# Patient Record
Sex: Male | Born: 1945 | Race: White | Hispanic: No | Marital: Married | Smoking: Former smoker
Health system: Southern US, Community
[De-identification: ages and names within clinical notes are randomized; demographics above are authoritative.]

## PROBLEM LIST (undated history)

## (undated) DIAGNOSIS — I10 Essential (primary) hypertension: Secondary | ICD-10-CM

## (undated) DIAGNOSIS — M1990 Unspecified osteoarthritis, unspecified site: Secondary | ICD-10-CM

## (undated) DIAGNOSIS — E559 Vitamin D deficiency, unspecified: Secondary | ICD-10-CM

## (undated) DIAGNOSIS — E119 Type 2 diabetes mellitus without complications: Secondary | ICD-10-CM

## (undated) DIAGNOSIS — G4730 Sleep apnea, unspecified: Secondary | ICD-10-CM

## (undated) DIAGNOSIS — C170 Malignant neoplasm of duodenum: Secondary | ICD-10-CM

## (undated) DIAGNOSIS — J449 Chronic obstructive pulmonary disease, unspecified: Secondary | ICD-10-CM

## (undated) DIAGNOSIS — I714 Abdominal aortic aneurysm, without rupture, unspecified: Secondary | ICD-10-CM

## (undated) DIAGNOSIS — I209 Angina pectoris, unspecified: Secondary | ICD-10-CM

## (undated) DIAGNOSIS — I2510 Atherosclerotic heart disease of native coronary artery without angina pectoris: Secondary | ICD-10-CM

## (undated) DIAGNOSIS — K219 Gastro-esophageal reflux disease without esophagitis: Secondary | ICD-10-CM

## (undated) HISTORY — DX: Essential (primary) hypertension: I10

## (undated) HISTORY — PX: JOINT REPLACEMENT: SHX530

## (undated) HISTORY — DX: Abdominal aortic aneurysm, without rupture: I71.4

## (undated) HISTORY — DX: Type 2 diabetes mellitus without complications: E11.9

## (undated) HISTORY — DX: Abdominal aortic aneurysm, without rupture, unspecified: I71.40

## (undated) HISTORY — PX: REPLACEMENT TOTAL KNEE: SUR1224

## (undated) HISTORY — PX: PENILE PROSTHESIS IMPLANT: SHX240

## (undated) HISTORY — DX: Chronic obstructive pulmonary disease, unspecified: J44.9

## (undated) HISTORY — PX: CHOLECYSTECTOMY: SHX55

## (undated) HISTORY — PX: BARIATRIC SURGERY: SHX1103

## (undated) HISTORY — PX: EYE SURGERY: SHX253

## (undated) HISTORY — DX: Atherosclerotic heart disease of native coronary artery without angina pectoris: I25.10

## (undated) HISTORY — DX: Vitamin D deficiency, unspecified: E55.9

## (undated) HISTORY — DX: Gastro-esophageal reflux disease without esophagitis: K21.9

## (undated) HISTORY — DX: Sleep apnea, unspecified: G47.30

---

## 1995-02-02 DIAGNOSIS — I2510 Atherosclerotic heart disease of native coronary artery without angina pectoris: Secondary | ICD-10-CM

## 1995-02-02 HISTORY — DX: Atherosclerotic heart disease of native coronary artery without angina pectoris: I25.10

## 1995-02-02 HISTORY — PX: CORONARY ARTERY BYPASS GRAFT: SHX141

## 2013-05-03 HISTORY — PX: CARDIAC CATHETERIZATION: SHX172

## 2013-09-06 ENCOUNTER — Ambulatory Visit (INDEPENDENT_AMBULATORY_CARE_PROVIDER_SITE_OTHER): Payer: Medicare Other | Admitting: Cardiovascular Disease

## 2013-09-06 ENCOUNTER — Encounter: Payer: Self-pay | Admitting: Cardiovascular Disease

## 2013-09-06 VITALS — BP 130/82 | HR 78 | Ht 67.0 in | Wt 266.5 lb

## 2013-09-06 DIAGNOSIS — Z01810 Encounter for preprocedural cardiovascular examination: Secondary | ICD-10-CM

## 2013-09-06 DIAGNOSIS — R0602 Shortness of breath: Secondary | ICD-10-CM

## 2013-09-06 DIAGNOSIS — E785 Hyperlipidemia, unspecified: Secondary | ICD-10-CM

## 2013-09-06 DIAGNOSIS — I10 Essential (primary) hypertension: Secondary | ICD-10-CM

## 2013-09-06 DIAGNOSIS — I25708 Atherosclerosis of coronary artery bypass graft(s), unspecified, with other forms of angina pectoris: Secondary | ICD-10-CM

## 2013-09-06 DIAGNOSIS — I209 Angina pectoris, unspecified: Secondary | ICD-10-CM

## 2013-09-06 DIAGNOSIS — E118 Type 2 diabetes mellitus with unspecified complications: Secondary | ICD-10-CM | POA: Insufficient documentation

## 2013-09-06 DIAGNOSIS — K219 Gastro-esophageal reflux disease without esophagitis: Secondary | ICD-10-CM

## 2013-09-06 DIAGNOSIS — Z951 Presence of aortocoronary bypass graft: Secondary | ICD-10-CM

## 2013-09-06 DIAGNOSIS — J438 Other emphysema: Secondary | ICD-10-CM

## 2013-09-06 DIAGNOSIS — J449 Chronic obstructive pulmonary disease, unspecified: Secondary | ICD-10-CM | POA: Insufficient documentation

## 2013-09-06 DIAGNOSIS — I25810 Atherosclerosis of coronary artery bypass graft(s) without angina pectoris: Secondary | ICD-10-CM

## 2013-09-06 NOTE — Assessment & Plan Note (Signed)
40 years of smoking, stopped 7 years ago. Currently on several inhalers with stable breathing

## 2013-09-06 NOTE — Patient Instructions (Addendum)
You are doing well. No medication changes were made.  We will schedule an echocardiogram for shortness of breath, CABG  Please call us if you have new issues that need to be addressed before your next appt.  Your physician wants you to follow-up in: 6 months.  You will receive a reminder letter in the mail two months in advance. If you don't receive a letter, please call our office to schedule the follow-up appointment.

## 2013-09-06 NOTE — Assessment & Plan Note (Signed)
Records were reviewed including his catheterization report from April 2015. Details in the history of present illness. Medical management recommended. Will need aggressive workup for any additional chest pain

## 2013-09-06 NOTE — Assessment & Plan Note (Addendum)
Hemoglobin A1c 6.4. Recommended he continue to aggressively watch his diet. Strongly suggested he work out on his treadmill daily

## 2013-09-06 NOTE — Assessment & Plan Note (Signed)
We have encouraged continued exercise, careful diet management in an effort to lose weight. 

## 2013-09-06 NOTE — Assessment & Plan Note (Signed)
Cholesterol is at goal on the current lipid regimen. No changes to the medications were made.  

## 2013-09-06 NOTE — Assessment & Plan Note (Signed)
Blood pressure is well controlled on today's visit. No changes made to the medications. 

## 2013-09-06 NOTE — Assessment & Plan Note (Addendum)
No symptoms of reflux. Tolerating PPI daily

## 2013-09-06 NOTE — Progress Notes (Signed)
Patient ID: Mario Ward, male    DOB: Sep 14, 1945, 68 y.o.   MRN: [id number]  HPI Comments: Mario Ward is a very pleasant 68 year old gentleman with a history of coronary artery disease, bypass x4 on 02/08/1995, history of diabetes, long history of smoking for 40 years stopped 7 years ago, obesity, obstructive sleep apnea who is compliant with his CPAP, hyperlipidemia who presents to establish care in the [place] office.  Reports that he has not had significant cardiac workup over the past 18 years until 05/02/2013. He developed chest discomfort, malaise, did not feel well. His wife called 52 and he was initially taken to a hospital in [place], and transferred to [facility] where he underwent cardiac catheterization  Cardiac catheterization showed severe three-vessel coronary artery disease. He had an occluded RCA, occluded mid LAD, occluded marginal branch off the circumflex,  He had a vein graft to the diagonal, vein graft to the OM, occluded vein graft to the RCA, patent LIMA to the LAD. Medical management was recommended Ejection fraction estimated at 55%, normal wall motion.   He does not do any regular exercise. He Has shortness of breath with exertion which she reports is his baseline. He can walk on a flat surface, shortness of breath with hills and stairs. Denies having any significant lower extremity edema. He reports that his HCTZ dose was previously decreased level uncertain if this was for low blood pressure or mild dehydration It was recommended that he have an echocardiogram from his cardiologist at [facility] given his shortness of breath. Right heart catheterization was not done  Hemoglobin A1c 6.4, low vitamin D, total cholesterol 137, LDL 65, HDL 37, triglycerides in the 170 range  Bypass surgery was performed at [facility] in [facility]  EKG today shows normal sinus rhythm with rate 78 beats a minute, no significant ST or T wave  changes   Outpatient Encounter Prescriptions as of 09/06/2013  Medication Sig  . albuterol (PROVENTIL) (2.5 MG/3ML) 0.083% nebulizer solution Take 2.5 mg by nebulization every 2 (two) hours as needed for wheezing or shortness of breath.  Marland Kitchen aspirin EC 81 MG tablet Take 81 mg by mouth daily.  . clopidogrel (PLAVIX) 75 MG tablet Take 75 mg by mouth daily.  . Exenatide ER (BYDUREON) 2 MG PEN Inject 2 mg into the skin once a week.  . Fluticasone-Salmeterol (ADVAIR DISKUS) 250-50 MCG/DOSE AEPB Inhale 1 puff into the lungs 2 (two) times daily.  . hydrochlorothiazide (MICROZIDE) 12.5 MG capsule Take 12.5 mg by mouth daily.  . Ipratropium-Albuterol (COMBIVENT RESPIMAT) 20-100 MCG/ACT AERS respimat Inhale 1 puff into the lungs every 6 (six) hours.  Marland Kitchen lisinopril (PRINIVIL,ZESTRIL) 40 MG tablet Take 40 mg by mouth daily.  . metFORMIN (GLUCOPHAGE) 1000 MG tablet Take 1,000 mg by mouth 2 (two) times daily with a meal.  . metoprolol (LOPRESSOR) 100 MG tablet Take 100 mg by mouth 2 (two) times daily.  . Multiple Vitamin (MULTIVITAMIN) tablet Take 1 tablet by mouth daily.  . nitroGLYCERIN (NITROSTAT) 0.4 MG SL tablet Place 0.4 mg under the tongue every 5 (five) minutes as needed for chest pain.  . NON FORMULARY Oxygen 2 liters at bedtime.  . NON FORMULARY CPAP at bedtime.  . Olopatadine HCl (PATADAY) 0.2 % SOLN Apply to eye.  . pantoprazole (PROTONIX) 40 MG tablet Take 40 mg by mouth daily.  . ranolazine (RANEXA) 500 MG 12 hr tablet Take 500 mg by mouth 2 (two) times daily.  . simvastatin (ZOCOR)  20 MG tablet Take 20 mg by mouth daily.  Marland Kitchen tiotropium (SPIRIVA) 18 MCG inhalation capsule Place 18 mcg into inhaler and inhale daily.   Review of Systems  Constitutional: Negative.   HENT: Negative.   Eyes: Negative.   Respiratory: Positive for shortness of breath.   Cardiovascular: Negative.   Gastrointestinal: Negative.   Endocrine: Negative.   Musculoskeletal: Negative.   Skin: Negative.    Allergic/Immunologic: Negative.   Neurological: Negative.   Hematological: Negative.   Psychiatric/Behavioral: Negative.   All other systems reviewed and are negative.   BP 130/82  Pulse 78  Ht 5\' 7"  (1.702 m)  Wt 266 lb 8 oz (120.884 kg)  BMI 41.73 kg/m2  Physical Exam  Nursing note and vitals reviewed. Constitutional: He is oriented to person, place, and time. He appears well-developed and well-nourished.  Obese  HENT:  Head: Normocephalic.  Nose: Nose normal.  Mouth/Throat: Oropharynx is clear and moist.  Eyes: Conjunctivae are normal. Pupils are equal, round, and reactive to light.  Neck: Normal range of motion. Neck supple. No JVD present.  Cardiovascular: Normal rate, regular rhythm, S1 normal, S2 normal, normal heart sounds and intact distal pulses.  Exam reveals no gallop and no friction rub.   No murmur heard. Pulmonary/Chest: Effort normal and breath sounds normal. No respiratory distress. He has no wheezes. He has no rales. He exhibits no tenderness.  Abdominal: Soft. Bowel sounds are normal. He exhibits no distension. There is no tenderness.  Musculoskeletal: Normal range of motion. He exhibits no edema and no tenderness.  Lymphadenopathy:    He has no cervical adenopathy.  Neurological: He is alert and oriented to person, place, and time. Coordination normal.  Skin: Skin is warm and dry. No rash noted. No erythema.  Psychiatric: He has a normal mood and affect. His behavior is normal. Judgment and thought content normal.      Assessment and Plan

## 2013-09-06 NOTE — Assessment & Plan Note (Signed)
He would like to proceed with workup for gastric bypass. Recent catheterization showing stable coronary disease with normal ejection fraction. He would be acceptable risk with no further workup needed

## 2013-09-06 NOTE — Assessment & Plan Note (Signed)
Occluded vein graft to the right. Other vein grafts patent.

## 2013-09-06 NOTE — Assessment & Plan Note (Signed)
Etiology of his shortness of breath is likely multifactorial. Echocardiogram has been ordered to rule out  pulmonary hypertension or underlying valvular disease . Recent cardiac catheterization showing occluded RCA and vein graft to the RCA . Other causes of breathing include obesity, deconditioning, COPD

## 2013-09-13 ENCOUNTER — Institutional Professional Consult (permissible substitution): Payer: Self-pay | Admitting: Internal Medicine

## 2013-09-14 ENCOUNTER — Other Ambulatory Visit: Payer: Self-pay

## 2013-09-14 ENCOUNTER — Other Ambulatory Visit (INDEPENDENT_AMBULATORY_CARE_PROVIDER_SITE_OTHER): Payer: Medicare Other

## 2013-09-14 DIAGNOSIS — I2510 Atherosclerotic heart disease of native coronary artery without angina pectoris: Secondary | ICD-10-CM

## 2013-09-14 DIAGNOSIS — I25708 Atherosclerosis of coronary artery bypass graft(s), unspecified, with other forms of angina pectoris: Secondary | ICD-10-CM

## 2013-09-14 DIAGNOSIS — R0602 Shortness of breath: Secondary | ICD-10-CM

## 2013-09-14 DIAGNOSIS — Z951 Presence of aortocoronary bypass graft: Secondary | ICD-10-CM

## 2013-09-19 ENCOUNTER — Other Ambulatory Visit: Payer: Self-pay | Admitting: Pulmonary Disease

## 2013-09-19 DIAGNOSIS — J438 Other emphysema: Secondary | ICD-10-CM

## 2013-09-29 ENCOUNTER — Encounter: Payer: Self-pay | Admitting: Pulmonary Disease

## 2013-09-29 DIAGNOSIS — J9611 Chronic respiratory failure with hypoxia: Secondary | ICD-10-CM | POA: Insufficient documentation

## 2013-10-01 ENCOUNTER — Telehealth: Payer: Self-pay

## 2013-10-01 NOTE — Telephone Encounter (Signed)
Spoke with pt, he is aware of results and recs.  He wishes to switch his 02 and cpap to another DME company but does not know the name of the company at this time.  He has a consult appt on Thursday 9/3 at 3:00.  He wishes to find the name of that company and do this on Thursday after having his company.    Just forwarding to you as an fyi.

## 2013-10-01 NOTE — Telephone Encounter (Signed)
Message copied by Len Blalock on Mon Oct 01, 2013  1:46 PM ------      Message from: Juanito Doom      Created: Sat Sep 29, 2013  6:54 AM       A,      Please let him know that even on CPAP his O2 saturation dropped to less than 88% quit a lot so he needs to have 2L O2 bleed-in through the CPAP machine.  Please arrange.      Thanks      B ------

## 2013-10-04 ENCOUNTER — Encounter: Payer: Self-pay | Admitting: Pulmonary Disease

## 2013-10-04 ENCOUNTER — Ambulatory Visit (INDEPENDENT_AMBULATORY_CARE_PROVIDER_SITE_OTHER): Payer: Medicare Other | Admitting: Pulmonary Disease

## 2013-10-04 VITALS — BP 128/64 | HR 91 | Ht 67.0 in | Wt 263.0 lb

## 2013-10-04 DIAGNOSIS — J9610 Chronic respiratory failure, unspecified whether with hypoxia or hypercapnia: Secondary | ICD-10-CM

## 2013-10-04 DIAGNOSIS — R0902 Hypoxemia: Secondary | ICD-10-CM

## 2013-10-04 DIAGNOSIS — J438 Other emphysema: Secondary | ICD-10-CM

## 2013-10-04 DIAGNOSIS — J9611 Chronic respiratory failure with hypoxia: Secondary | ICD-10-CM

## 2013-10-04 DIAGNOSIS — R0602 Shortness of breath: Secondary | ICD-10-CM

## 2013-10-04 DIAGNOSIS — I209 Angina pectoris, unspecified: Secondary | ICD-10-CM

## 2013-10-04 DIAGNOSIS — I25810 Atherosclerosis of coronary artery bypass graft(s) without angina pectoris: Secondary | ICD-10-CM

## 2013-10-04 DIAGNOSIS — G4733 Obstructive sleep apnea (adult) (pediatric): Secondary | ICD-10-CM

## 2013-10-04 MED ORDER — TIOTROPIUM BROMIDE MONOHYDRATE 2.5 MCG/ACT IN AERS: 2.0000 | INHALATION_SPRAY | Freq: Every day | RESPIRATORY_TRACT | Status: DC

## 2013-10-04 NOTE — Assessment & Plan Note (Signed)
We need to obtain records from his prior pulmonologist for pulmonary function testing. However, I do not consider him to have very severe COPD considering the fact that he has no cough and no history of recurrent exacerbations. I believe that the majority of his dyspnea is related to his severe obesity and deconditioning.  I believe that it would be safe for him to undergo bariatric surgery, and in fact I think it will help his overall condition dramatically.  Plan: -Continue Spiriva and Advair -Obtain records of prior pulmonary office visits as well as pulmonary function testing -Flu shot as soon as available -Followup 6 months

## 2013-10-04 NOTE — Assessment & Plan Note (Signed)
He will continue to use 2 L of oxygen through CPAP at night

## 2013-10-04 NOTE — Assessment & Plan Note (Signed)
He had a polysomnogram in 2012 which reportedly showed evidence of obstructive sleep apnea. He wishes to change DME companies to something local so I will prescribe that while obtain records of his polysomnogram.  Plan: -Continue CPAP at 10 cm of water with 2 L of oxygen bleed in

## 2013-10-04 NOTE — Patient Instructions (Signed)
Stop Spiriva Handihaler; start using Spiriva Respimat 2 puffs daily Keep taking the Advair We will request records from your old pulmonologist We will see you back in 6 months or sooner if needed

## 2013-10-04 NOTE — Assessment & Plan Note (Signed)
I believe that this is primarily due to his morbid obesity. He does have COPD but considering his minimal cough and airway symptoms I think the majority of his dyspnea is related to his obesity and deconditioning.

## 2013-10-04 NOTE — Progress Notes (Signed)
Subjective:    Patient ID: Mario Ward, male    DOB: April 14, 1945, 68 y.o.   MRN: [id number]  HPI  Mario Ward just moved her from [facility] and he had previously been followed by Mario Ward in [place] who was affiliated with [facility].    He has COPD and obstructive sleep apnea.  He describes his COPD as "pretty bad " and states that he gets short of breath on exertion.  He is interested in gastric bypass.  He would like to lose a lot of weight because he thinks that his dyspnea is due primarily to obesity.  He says taht his primary problem is dyspnea on exertion.  Typically when he tries to cut his grass he as to stop after several minutes frequently to catch his breath.  He does not have problems with frequent exacerbations of COPD requiring antibiotics or prednisone.  The last time that happened was several years ago.  He does not cough or have mucus production.    He continues to take Spiriva daily. He wants to switch to the restroom at because he says sometimes he doesn't feel like he's getting anything from the HandiHaler. He also takes Advair.  He has never been hospitalized for a respiratory problem.  He was hospitalized for chest pain back in April and had a normal catheterization.  He smoked 1 ppd fof 45 years and quit in 2008.    He had a polysomnogram in 2012 that showed OSA. He has been on CPAP since then. He says that with this he has minimal daytime somnolence.  Past Medical History  Diagnosis Date  . Hypertension   . Diabetes mellitus without complication   . GERD (gastroesophageal reflux disease)   . COPD (chronic obstructive pulmonary disease)   . Vitamin D deficiency   . Coronary artery disease 1997    CABG x 4   . Sleep apnea     uses a CPAP at night.      Family History  Problem Relation Age of Onset  . Heart disease Mother   . Heart attack Mother 56  . Heart disease Father   . Heart attack Father 74  . Heart disease Brother   . Heart attack Brother 47      History   Social History  . Marital Status: Married    Spouse Name: N/A    Number of Children: N/A  . Years of Education: N/A   Occupational History  . Not on file.   Social History Main Topics  . Smoking status: Former Smoker -- 1.00 packs/day for 40 years    Types: Cigarettes    Quit date: 09/07/2006  . Smokeless tobacco: Never Used  . Alcohol Use: No  . Drug Use: No  . Sexual Activity: Not on file   Other Topics Concern  . Not on file   Social History Narrative  . No narrative on file     No Known Allergies   Outpatient Prescriptions Prior to Visit  Medication Sig Dispense Refill  . albuterol (PROVENTIL) (2.5 MG/3ML) 0.083% nebulizer solution Take 2.5 mg by nebulization every 2 (two) hours as needed for wheezing or shortness of breath.      Marland Kitchen aspirin EC 81 MG tablet Take 81 mg by mouth daily.      . clopidogrel (PLAVIX) 75 MG tablet Take 75 mg by mouth daily.      . Exenatide ER (BYDUREON) 2 MG PEN Inject 2 mg into the skin  once a week.      . Fluticasone-Salmeterol (ADVAIR DISKUS) 250-50 MCG/DOSE AEPB Inhale 1 puff into the lungs 2 (two) times daily.      . hydrochlorothiazide (MICROZIDE) 12.5 MG capsule Take 12.5 mg by mouth daily.      . Ipratropium-Albuterol (COMBIVENT RESPIMAT) 20-100 MCG/ACT AERS respimat Inhale 1 puff into the lungs every 6 (six) hours.      Marland Kitchen lisinopril (PRINIVIL,ZESTRIL) 40 MG tablet Take 40 mg by mouth daily.      . metFORMIN (GLUCOPHAGE) 1000 MG tablet Take 1,000 mg by mouth 2 (two) times daily with a meal.      . metoprolol (LOPRESSOR) 100 MG tablet Take 100 mg by mouth 2 (two) times daily.      . Multiple Vitamin (MULTIVITAMIN) tablet Take 1 tablet by mouth daily.      . nitroGLYCERIN (NITROSTAT) 0.4 MG SL tablet Place 0.4 mg under the tongue every 5 (five) minutes as needed for chest pain.      . NON FORMULARY Oxygen 2 liters at bedtime.      . NON FORMULARY CPAP at bedtime.      . Olopatadine HCl (PATADAY) 0.2 % SOLN Apply to eye.       . pantoprazole (PROTONIX) 40 MG tablet Take 40 mg by mouth daily.      . ranolazine (RANEXA) 500 MG 12 hr tablet Take 500 mg by mouth 2 (two) times daily.      . simvastatin (ZOCOR) 20 MG tablet Take 20 mg by mouth daily.      Marland Kitchen tiotropium (SPIRIVA) 18 MCG inhalation capsule Place 18 mcg into inhaler and inhale daily.       No facility-administered medications prior to visit.      Review of Systems  Constitutional: Negative for fever and unexpected weight change.  HENT: Negative for congestion, dental problem, ear pain, nosebleeds, postnasal drip, rhinorrhea, sinus pressure, sneezing, sore throat and trouble swallowing.   Eyes: Negative for redness and itching.  Respiratory: Positive for shortness of breath. Negative for cough, chest tightness and wheezing.   Cardiovascular: Negative for palpitations and leg swelling.  Gastrointestinal: Negative for nausea and vomiting.  Genitourinary: Negative for dysuria.  Musculoskeletal: Negative for joint swelling.  Skin: Negative for rash.  Neurological: Negative for headaches.  Hematological: Does not bruise/bleed easily.  Psychiatric/Behavioral: Negative for dysphoric mood. The patient is not nervous/anxious.        Objective:   Physical Exam  Filed Vitals:   10/04/13 1521  BP: 128/64  Pulse: 91  Height: 5\' 7"  (1.702 m)  Weight: 263 lb (119.296 kg)  SpO2: 95%   room air  Gen: Morbidly obese but well appearing, no acute distress HEENT: NCAT, PERRL, EOMi, OP clear, neck supple without masses PULM: CTA B CV: RRR, no mgr, no JVD AB: BS+, soft, nontender, no hsm Ext: warm, no edema, no clubbing, no cyanosis Derm: no rash or skin breakdown Neuro: A&Ox4, CN II-XII intact, strength 5/5 in all 4 extremities       Assessment & Plan:   COPD (chronic obstructive pulmonary disease) We need to obtain records from his prior pulmonologist for pulmonary function testing. However, I do not consider him to have very severe COPD  considering the fact that he has no cough and no history of recurrent exacerbations. I believe that the majority of his dyspnea is related to his severe obesity and deconditioning.  I believe that it would be safe for him to undergo bariatric surgery, and  in fact I think it will help his overall condition dramatically.  Plan: -Continue Spiriva and Advair -Obtain records of prior pulmonary office visits as well as pulmonary function testing -Flu shot as soon as available -Followup 6 months  Chronic hypoxemic respiratory failure He will continue to use 2 L of oxygen through CPAP at night  SOB (shortness of breath) on exertion I believe that this is primarily due to his morbid obesity. He does have COPD but considering his minimal cough and airway symptoms I think the majority of his dyspnea is related to his obesity and deconditioning.  Obstructive sleep apnea He had a polysomnogram in 2012 which reportedly showed evidence of obstructive sleep apnea. He wishes to change DME companies to something local so I will prescribe that while obtain records of his polysomnogram.  Plan: -Continue CPAP at 10 cm of water with 2 L of oxygen bleed in   Updated Medication List Outpatient Encounter Prescriptions as of 10/04/2013  Medication Sig  . albuterol (PROVENTIL) (2.5 MG/3ML) 0.083% nebulizer solution Take 2.5 mg by nebulization every 2 (two) hours as needed for wheezing or shortness of breath.  Marland Kitchen aspirin EC 81 MG tablet Take 81 mg by mouth daily.  . clopidogrel (PLAVIX) 75 MG tablet Take 75 mg by mouth daily.  . Exenatide ER (BYDUREON) 2 MG PEN Inject 2 mg into the skin once a week.  . Fluticasone-Salmeterol (ADVAIR DISKUS) 250-50 MCG/DOSE AEPB Inhale 1 puff into the lungs 2 (two) times daily.  . hydrochlorothiazide (MICROZIDE) 12.5 MG capsule Take 12.5 mg by mouth daily.  . Ipratropium-Albuterol (COMBIVENT RESPIMAT) 20-100 MCG/ACT AERS respimat Inhale 1 puff into the lungs every 6 (six) hours.  Marland Kitchen  lisinopril (PRINIVIL,ZESTRIL) 40 MG tablet Take 40 mg by mouth daily.  . metFORMIN (GLUCOPHAGE) 1000 MG tablet Take 1,000 mg by mouth 2 (two) times daily with a meal.  . metoprolol (LOPRESSOR) 100 MG tablet Take 100 mg by mouth 2 (two) times daily.  . Multiple Vitamin (MULTIVITAMIN) tablet Take 1 tablet by mouth daily.  . nitroGLYCERIN (NITROSTAT) 0.4 MG SL tablet Place 0.4 mg under the tongue every 5 (five) minutes as needed for chest pain.  . NON FORMULARY Oxygen 2 liters at bedtime.  . NON FORMULARY CPAP at bedtime.  . Olopatadine HCl (PATADAY) 0.2 % SOLN Apply to eye.  . pantoprazole (PROTONIX) 40 MG tablet Take 40 mg by mouth daily.  . ranolazine (RANEXA) 500 MG 12 hr tablet Take 500 mg by mouth 2 (two) times daily.  . simvastatin (ZOCOR) 20 MG tablet Take 20 mg by mouth daily.  . [DISCONTINUED] tiotropium (SPIRIVA) 18 MCG inhalation capsule Place 18 mcg into inhaler and inhale daily.  . Tiotropium Bromide Monohydrate (SPIRIVA RESPIMAT) 2.5 MCG/ACT AERS Inhale 2 puffs into the lungs daily.

## 2013-10-18 ENCOUNTER — Telehealth: Payer: Self-pay | Admitting: Pulmonary Disease

## 2013-10-18 NOTE — Telephone Encounter (Signed)
Spoke with rep at [employer]  She states that we need to provide the pt with his most recent ov notes to take to her  She states that her fax machine is not working and we need to just give the notes to the pt since he is coming in for appt tomorrow  I spoke with the pt and he is aware to ask for notes when he comes in  Nothing further needed

## 2013-10-19 ENCOUNTER — Ambulatory Visit (INDEPENDENT_AMBULATORY_CARE_PROVIDER_SITE_OTHER): Payer: Medicare Other | Admitting: Pulmonary Disease

## 2013-10-19 ENCOUNTER — Encounter: Payer: Self-pay | Admitting: Pulmonary Disease

## 2013-10-19 VITALS — BP 124/78 | HR 88 | Temp 98.1°F | Ht 67.0 in | Wt 264.8 lb

## 2013-10-19 DIAGNOSIS — J438 Other emphysema: Secondary | ICD-10-CM

## 2013-10-19 NOTE — Progress Notes (Signed)
Mario Ward was sent her today to address his pulmonary clearance for bariatric surgery.  I addressed this in his note from earlier this month.   No charge for today

## 2013-10-19 NOTE — Patient Instructions (Signed)
Proceed with bariatric surgery F/u with me as previously scheduled

## 2013-10-21 ENCOUNTER — Encounter: Payer: Self-pay | Admitting: Pulmonary Disease

## 2013-10-21 ENCOUNTER — Encounter: Payer: Self-pay | Admitting: Cardiovascular Disease

## 2013-10-29 ENCOUNTER — Ambulatory Visit (INDEPENDENT_AMBULATORY_CARE_PROVIDER_SITE_OTHER): Payer: Medicare Other | Admitting: Cardiovascular Disease

## 2013-10-29 ENCOUNTER — Encounter: Payer: Self-pay | Admitting: Cardiovascular Disease

## 2013-10-29 VITALS — BP 138/80 | HR 85 | Ht 67.0 in | Wt 263.0 lb

## 2013-10-29 DIAGNOSIS — E118 Type 2 diabetes mellitus with unspecified complications: Secondary | ICD-10-CM

## 2013-10-29 DIAGNOSIS — I10 Essential (primary) hypertension: Secondary | ICD-10-CM

## 2013-10-29 DIAGNOSIS — E785 Hyperlipidemia, unspecified: Secondary | ICD-10-CM

## 2013-10-29 DIAGNOSIS — Z01810 Encounter for preprocedural cardiovascular examination: Secondary | ICD-10-CM

## 2013-10-29 DIAGNOSIS — Z01818 Encounter for other preprocedural examination: Secondary | ICD-10-CM

## 2013-10-29 DIAGNOSIS — I209 Angina pectoris, unspecified: Secondary | ICD-10-CM

## 2013-10-29 DIAGNOSIS — I25810 Atherosclerosis of coronary artery bypass graft(s) without angina pectoris: Secondary | ICD-10-CM

## 2013-10-29 DIAGNOSIS — R0602 Shortness of breath: Secondary | ICD-10-CM

## 2013-10-29 DIAGNOSIS — Z951 Presence of aortocoronary bypass graft: Secondary | ICD-10-CM

## 2013-10-29 NOTE — Assessment & Plan Note (Signed)
Chronic shortness of breath likely multifactorial including deconditioning, morbid obesity, underlying COPD. Fluid status is difficult to determine though he appears relatively euvolemic.

## 2013-10-29 NOTE — Assessment & Plan Note (Signed)
He will be working with a nutritionist. We have encouraged continued exercise, careful diet management in an effort to lose weight.

## 2013-10-29 NOTE — Assessment & Plan Note (Signed)
Blood pressure is well controlled on today's visit. No changes made to the medications. 

## 2013-10-29 NOTE — Assessment & Plan Note (Signed)
Currently with no symptoms of angina. No further workup at this time. Continue current medication regimen. 

## 2013-10-29 NOTE — Assessment & Plan Note (Signed)
He is interested in gastric bypass surgery. He has followup with Dr. Darnell Level.

## 2013-10-29 NOTE — Assessment & Plan Note (Signed)
Known severe three-vessel CAD, occluded bypass graft to the RCA (as well as occluded native RCA) with collaterals from left to right. He also has underlying COPD. Given these, he would be moderate risk for gastric bypass surgery. No further testing is needed as he had recent cardiac catheterization. Medical management was recommended. Would recommend minimizing IV fluids in the perioperative and postoperative periods to avoid acute diastolic heart failure.  He would be high risk of arrhythmia such as atrial fibrillation. This was discussed with him in detail. At this point, he is willing to accept any risk and would like to proceed with the surgery. He is indicated the procedure would be laparoscopic and would prefer the gastric bypass surgery.

## 2013-10-29 NOTE — Assessment & Plan Note (Signed)
Encouraged him to stay on his simvastatin. Goal LDL less than 70

## 2013-10-29 NOTE — Patient Instructions (Addendum)
You are doing well. No medication changes were made.  Please call us if you have new issues that need to be addressed before your next appt.  Your physician wants you to follow-up in: 6 months.  You will receive a reminder letter in the mail two months in advance. If you don't receive a letter, please call our office to schedule the follow-up appointment.   

## 2013-10-29 NOTE — Progress Notes (Signed)
Patient ID: Mario Ward, male    DOB: 1945/05/03, 68 y.o.   MRN: [id number]  HPI Comments: Mr. Quesinberry is a 68 year old gentleman with a history of coronary artery disease, bypass x4 on 02/08/1995, history of diabetes, long history of smoking for 40 years stopped 7 years ago, obesity, obstructive sleep apnea who is compliant with his CPAP, hyperlipidemia who presents for routine follow up.  Followup today, he reports that he is doing well. He has stable mild chronic shortness of breath from his COPD, unchanged from his prior office visit. He is relatively active, denies any significant chest discomfort or symptoms concerning for angina. No change in his symptoms from his prior cardiac catheterization in April 2015. He is interested in gastric bypass surgery. He is currently proceeding through the evaluation and scheduled to meet a nutritionist. He reports that he is seen pulmonary, Dr. Lake Bells. He denies any recent COPD exacerbations. Denies any significant coughing. He does not do any regular exercise.   05/02/2013, He developed chest discomfort, malaise, did not feel well. His wife called 38 and he was initially taken to a hospital in [place], and transferred to [facility] where he underwent cardiac catheterization  Cardiac catheterization showed severe three-vessel coronary artery disease. He had an occluded RCA, occluded mid LAD, occluded marginal branch off the circumflex,  He had a vein graft to the diagonal, vein graft to the OM, occluded vein graft to the RCA, patent LIMA to the LAD. Medical management was recommended Ejection fraction estimated at 55%, normal wall motion.   Previous lab work; Hemoglobin A1c 6.4, low vitamin D, total cholesterol 137, LDL 65, HDL 37, triglycerides in the 170 range  Bypass surgery was performed at [facility] in [facility]  EKG today shows normal sinus rhythm with rate 84 beats a minute, no significant ST or T wave  changes   Outpatient Encounter Prescriptions as of 10/29/2013  Medication Sig  . albuterol (PROVENTIL) (2.5 MG/3ML) 0.083% nebulizer solution Take 2.5 mg by nebulization every 2 (two) hours as needed for wheezing or shortness of breath.  Marland Kitchen aspirin EC 81 MG tablet Take 81 mg by mouth daily.  . clopidogrel (PLAVIX) 75 MG tablet Take 75 mg by mouth daily.  . Exenatide ER (BYDUREON) 2 MG PEN Inject 2 mg into the skin once a week.  . Fluticasone-Salmeterol (ADVAIR DISKUS) 250-50 MCG/DOSE AEPB Inhale 1 puff into the lungs 2 (two) times daily.  . hydrochlorothiazide (MICROZIDE) 12.5 MG capsule Take 12.5 mg by mouth daily.  . Ipratropium-Albuterol (COMBIVENT RESPIMAT) 20-100 MCG/ACT AERS respimat Inhale 1 puff into the lungs every 6 (six) hours.  Marland Kitchen lisinopril (PRINIVIL,ZESTRIL) 40 MG tablet Take 40 mg by mouth daily.  . metFORMIN (GLUCOPHAGE) 1000 MG tablet Take 1,000 mg by mouth 2 (two) times daily with a meal.  . metoprolol (LOPRESSOR) 100 MG tablet Take 100 mg by mouth 2 (two) times daily.  . Multiple Vitamin (MULTIVITAMIN) tablet Take 1 tablet by mouth daily.  . nitroGLYCERIN (NITROSTAT) 0.4 MG SL tablet Place 0.4 mg under the tongue every 5 (five) minutes as needed for chest pain.  . NON FORMULARY Oxygen 2 liters at bedtime.  . NON FORMULARY CPAP at bedtime.  . Olopatadine HCl (PATADAY) 0.2 % SOLN Apply to eye.  . pantoprazole (PROTONIX) 40 MG tablet Take 40 mg by mouth daily.  . simvastatin (ZOCOR) 20 MG tablet Take 20 mg by mouth daily.  . Tiotropium Bromide Monohydrate (SPIRIVA RESPIMAT) 2.5 MCG/ACT AERS Inhale 2 puffs  into the lungs daily.    Review of Systems  Constitutional: Negative.   HENT: Negative.   Eyes: Negative.   Respiratory: Positive for shortness of breath.   Cardiovascular: Negative.   Gastrointestinal: Negative.   Endocrine: Negative.   Musculoskeletal: Negative.   Skin: Negative.   Allergic/Immunologic: Negative.   Neurological: Negative.   Hematological:  Negative.   Psychiatric/Behavioral: Negative.   All other systems reviewed and are negative.   BP 138/80  Pulse 85  Ht 5\' 7"  (1.702 m)  Wt 263 lb (119.296 kg)  BMI 41.18 kg/m2  Physical Exam  Nursing note and vitals reviewed. Constitutional: He is oriented to person, place, and time. He appears well-developed and well-nourished.  Morbidly Obese  HENT:  Head: Normocephalic.  Nose: Nose normal.  Mouth/Throat: Oropharynx is clear and moist.  Eyes: Conjunctivae are normal. Pupils are equal, round, and reactive to light.  Neck: Normal range of motion. Neck supple. No JVD present.  Cardiovascular: Normal rate, regular rhythm, S1 normal, S2 normal, normal heart sounds and intact distal pulses.  Exam reveals no gallop and no friction rub.   No murmur heard. Pulmonary/Chest: Effort normal and breath sounds normal. No respiratory distress. He has no wheezes. He has no rales. He exhibits no tenderness.  Abdominal: Soft. Bowel sounds are normal. He exhibits no distension. There is no tenderness.  Musculoskeletal: Normal range of motion. He exhibits no edema and no tenderness.  Lymphadenopathy:    He has no cervical adenopathy.  Neurological: He is alert and oriented to person, place, and time. Coordination normal.  Skin: Skin is warm and dry. No rash noted. No erythema.  Psychiatric: He has a normal mood and affect. His behavior is normal. Judgment and thought content normal.      Assessment and Plan

## 2013-11-02 ENCOUNTER — Ambulatory Visit: Payer: Self-pay | Admitting: Specialist

## 2013-11-20 ENCOUNTER — Other Ambulatory Visit: Payer: Self-pay | Admitting: Physician Assistant

## 2013-11-26 ENCOUNTER — Telehealth: Payer: Self-pay

## 2013-11-26 NOTE — Telephone Encounter (Signed)
Returned call to [employer]. Adv her per Dr.Gollan o/v on 9/28 pt is cleared for Sx. A copy of o/v indicating clearance fax to [phone]

## 2013-11-26 NOTE — Telephone Encounter (Signed)
Calling regarding pt surgical clearance that was faxed on 10/20 please advise.

## 2013-11-27 NOTE — Telephone Encounter (Signed)
This encounter was created in error - please disregard.

## 2013-11-28 ENCOUNTER — Telehealth: Payer: Self-pay

## 2013-11-28 NOTE — Telephone Encounter (Signed)
Spoke w/ pt.  Advised him that Dr. Rockey Situ recommends he stop his Plavix for 5 days before his EGD.  Faxed clearance to [facility].

## 2013-11-28 NOTE — Telephone Encounter (Signed)
Pt called again upset, told him that we would try and get an answer by the end of the say. Per [place]. He is aware we would try to.

## 2013-11-28 NOTE — Telephone Encounter (Signed)
[facility] needing this done today please, for he needs to stop his meds.

## 2013-11-28 NOTE — Telephone Encounter (Signed)
Paperwork is in Mario Ward's folder for review.

## 2013-11-28 NOTE — Telephone Encounter (Signed)
Pt called, states he is having an EGD on Monday 12/03/2013 and needs directions for stopping his Plavix. Please call.

## 2013-11-29 ENCOUNTER — Telehealth: Payer: Self-pay | Admitting: *Deleted

## 2013-11-29 NOTE — Telephone Encounter (Signed)
Pt called stated Dr Synthia Innocent office did not receive clearance. I refaxed.

## 2013-11-29 NOTE — Telephone Encounter (Signed)
Please stopped by wanting the cardiac clearance for the [facility] [place]. Please call patient. Please fax to [phone] tele [phone]. The cardiac clearance was done for his egd at [facility].

## 2013-11-29 NOTE — Telephone Encounter (Signed)
Spoke w/ pt.  Advised him that I have faxed his last office note w/ clearance to number requested.  He is appreciative and will call back w/ further questions or concerns.

## 2013-12-03 ENCOUNTER — Ambulatory Visit: Payer: Self-pay | Admitting: Gastroenterology

## 2014-01-11 ENCOUNTER — Ambulatory Visit: Payer: Self-pay | Admitting: Gastroenterology

## 2014-02-01 DIAGNOSIS — C170 Malignant neoplasm of duodenum: Secondary | ICD-10-CM

## 2014-02-01 HISTORY — DX: Malignant neoplasm of duodenum: C17.0

## 2014-02-20 ENCOUNTER — Encounter: Payer: Self-pay | Admitting: Pulmonary Disease

## 2014-03-05 ENCOUNTER — Telehealth: Payer: Self-pay

## 2014-03-05 ENCOUNTER — Ambulatory Visit (INDEPENDENT_AMBULATORY_CARE_PROVIDER_SITE_OTHER): Payer: Medicare Other | Admitting: Cardiovascular Disease

## 2014-03-05 ENCOUNTER — Encounter: Payer: Self-pay | Admitting: Cardiovascular Disease

## 2014-03-05 VITALS — BP 110/78 | HR 75 | Ht 67.0 in | Wt 251.8 lb

## 2014-03-05 DIAGNOSIS — I25708 Atherosclerosis of coronary artery bypass graft(s), unspecified, with other forms of angina pectoris: Secondary | ICD-10-CM

## 2014-03-05 DIAGNOSIS — J438 Other emphysema: Secondary | ICD-10-CM

## 2014-03-05 DIAGNOSIS — I714 Abdominal aortic aneurysm, without rupture, unspecified: Secondary | ICD-10-CM

## 2014-03-05 DIAGNOSIS — Z951 Presence of aortocoronary bypass graft: Secondary | ICD-10-CM

## 2014-03-05 DIAGNOSIS — R0989 Other specified symptoms and signs involving the circulatory and respiratory systems: Secondary | ICD-10-CM

## 2014-03-05 DIAGNOSIS — D3A00 Benign carcinoid tumor of unspecified site: Secondary | ICD-10-CM | POA: Insufficient documentation

## 2014-03-05 DIAGNOSIS — I10 Essential (primary) hypertension: Secondary | ICD-10-CM

## 2014-03-05 DIAGNOSIS — E118 Type 2 diabetes mellitus with unspecified complications: Secondary | ICD-10-CM

## 2014-03-05 NOTE — Patient Instructions (Addendum)
You are doing well. No medication changes were made.  Ok to stop plavix for 5 days prior to endoscopy  We will schedule a carotid ultrasound for bruit  Goal total cholesterol is <150 Goal LDL <70  Please call us if you have new issues that need to be addressed before your next appt.  Your physician wants you to follow-up in: 6 months.  You will receive a reminder letter in the mail two months in advance. If you don't receive a letter, please call our office to schedule the follow-up appointment.

## 2014-03-05 NOTE — Assessment & Plan Note (Addendum)
Recent diagnosis on endoscopy. Also seen on CT scan of the chest and abdomen. Repeat endoscopy scheduled 04/11/2014. He will need to hold his Plavix 5 days prior to the endoscopy

## 2014-03-05 NOTE — Assessment & Plan Note (Signed)
We have encouraged continued exercise, careful diet management in an effort to lose weight. 

## 2014-03-05 NOTE — Assessment & Plan Note (Signed)
Currently with no symptoms of angina. No further workup at this time. Continue current medication regimen. Last catheterization in 2015. Results were discussed with him again in detail

## 2014-03-05 NOTE — Telephone Encounter (Signed)
Pt seen today

## 2014-03-05 NOTE — Assessment & Plan Note (Signed)
Blood pressure is well controlled on today's visit. No changes made to the medications. 

## 2014-03-05 NOTE — Progress Notes (Signed)
Patient ID: Mario Ward, male    DOB: April 17, 1945, 69 y.o.   MRN: [id number]  HPI Comments: [name] is a 69 year old gentleman with a history of coronary artery disease, bypass x4 on 02/08/1995, history of diabetes, long history of smoking for 40 years stopped 7 years ago, obesity, obstructive sleep apnea who is compliant with his CPAP, hyperlipidemia who presents for routine follow up of his coronary artery disease. Last cardiac catheterization April 2015 4.5 cm AAA  He reports that in preparation for gastric bypass surgery he had endoscopy. This showed a mass in his duodenal bulb consistent with well-differentiated neuroendocrine tumor, carcinoid tumor. There was no lymphatic involvement per the notes. He is scheduled to have repeat endoscopy in March 2016. He also has follow-up with GI in [place]. Recent hemoglobin A1c 6.3. He does not remember his most recent cholesterol measurements. This was done by primary care Recent CT scan chest and abdomen showing 4.5 cm infrarenal abdominal aortic aneurysm, recently seen by Dr. Ronalee Belts. Medical management at this time. It sounds as if he would be a good candidate for stenting if needed at a later date  EKG on today's visit shows normal sinus rhythm with rate 75 beats minute, no significant ST or T-wave changes   He has stable mild chronic shortness of breath from his COPD, unchanged from his prior office visit.  He is relatively active, denies any significant chest discomfort or symptoms concerning for angina. No change in his symptoms from his prior cardiac catheterization in April 2015. Previously seen by Dr. Lake Bells. He denies any recent COPD exacerbations. Denies any significant coughing. He does not do any regular exercise.   05/02/2013, He developed chest discomfort, malaise, did not feel well. His wife called 69 and he was initially taken to a hospital in [place], and transferred to [facility] where he underwent cardiac  catheterization  Cardiac catheterization showed severe three-vessel coronary artery disease. He had an occluded RCA, occluded mid LAD, occluded marginal branch off the circumflex,  He had a vein graft to the diagonal, vein graft to the OM, occluded vein graft to the RCA, patent LIMA to the LAD. Medical management was recommended Ejection fraction estimated at 55%, normal wall motion.   Previous lab work; Hemoglobin A1c 6.4, low vitamin D, total cholesterol 137, LDL 65, HDL 37, triglycerides in the 170 range Bypass surgery was performed at [facility] in [facility]    No Known Allergies  Outpatient Encounter Prescriptions as of 03/05/2014  Medication Sig  . albuterol (PROVENTIL) (2.5 MG/3ML) 0.083% nebulizer solution Take 2.5 mg by nebulization every 2 (two) hours as needed for wheezing or shortness of breath.  Marland Kitchen aspirin EC 81 MG tablet Take 81 mg by mouth daily.  . clopidogrel (PLAVIX) 75 MG tablet Take 75 mg by mouth daily.  . Exenatide ER (BYDUREON) 2 MG PEN Inject 2 mg into the skin once a week.  . Fluticasone-Salmeterol (ADVAIR DISKUS) 250-50 MCG/DOSE AEPB Inhale 1 puff into the lungs 2 (two) times daily.  . hydrochlorothiazide (MICROZIDE) 12.5 MG capsule Take 12.5 mg by mouth daily.  . Ipratropium-Albuterol (COMBIVENT RESPIMAT) 20-100 MCG/ACT AERS respimat Inhale 1 puff into the lungs every 6 (six) hours.  Marland Kitchen lisinopril (PRINIVIL,ZESTRIL) 40 MG tablet Take 40 mg by mouth daily.  . metFORMIN (GLUCOPHAGE) 1000 MG tablet Take 1,000 mg by mouth 2 (two) times daily with a meal.  . metoprolol (LOPRESSOR) 100 MG tablet Take 100 mg by mouth 2 (two) times daily.  Marland Kitchen  Multiple Vitamin (MULTIVITAMIN) tablet Take 1 tablet by mouth daily.  . nitroGLYCERIN (NITROSTAT) 0.4 MG SL tablet Place 0.4 mg under the tongue every 5 (five) minutes as needed for chest pain.  . NON FORMULARY Oxygen 2 liters at bedtime.  . NON FORMULARY CPAP at bedtime.  . Olopatadine HCl (PATADAY) 0.2 %  SOLN Apply to eye.  . pantoprazole (PROTONIX) 40 MG tablet Take 40 mg by mouth daily.  . simvastatin (ZOCOR) 20 MG tablet Take 20 mg by mouth daily.  . Tiotropium Bromide Monohydrate (SPIRIVA RESPIMAT) 2.5 MCG/ACT AERS Inhale 2 puffs into the lungs daily.    Past Medical History  Diagnosis Date  . Hypertension   . Diabetes mellitus without complication   . GERD (gastroesophageal reflux disease)   . COPD (chronic obstructive pulmonary disease)   . Vitamin D deficiency   . Coronary artery disease 1997    CABG x 4   . Sleep apnea     uses a CPAP at night.   . AAA (abdominal aortic aneurysm)     Past Surgical History  Procedure Laterality Date  . Replacement total knee      right  . Coronary artery bypass graft  1997    CABG x 4 in [place]   . Cardiac catheterization  05/03/2013    [facility] in [place] History  reports that he quit smoking about 7 years ago. His smoking use included Cigarettes. He has a 40 pack-year smoking history. He has never used smokeless tobacco. He reports that he does not drink alcohol or use illicit drugs.  Family History family history includes Heart attack (age of onset: 6) in his brother; Heart attack (age of onset: 19) in his father; Heart attack (age of onset: 32) in his mother; Heart disease in his brother, father, and mother.   Review of Systems  Constitutional: Negative.   Respiratory: Positive for shortness of breath.   Cardiovascular: Negative.   Gastrointestinal: Negative.   Musculoskeletal: Negative.   Neurological: Negative.   Hematological: Negative.   Psychiatric/Behavioral: Negative.   All other systems reviewed and are negative.   BP 110/78 mmHg  Pulse 75  Ht 5\' 7"  (1.702 m)  Wt 251 lb 12 oz (114.193 kg)  BMI 39.42 kg/m2  Physical Exam  Constitutional: He is oriented to person, place, and time. He appears well-developed and well-nourished.  Morbidly Obese  HENT:  Head: Normocephalic.  Nose: Nose normal.   Mouth/Throat: Oropharynx is clear and moist.  Eyes: Conjunctivae are normal. Pupils are equal, round, and reactive to light.  Neck: Normal range of motion. Neck supple. No JVD present.  Cardiovascular: Normal rate, regular rhythm, S1 normal, S2 normal, normal heart sounds and intact distal pulses.  Exam reveals no gallop and no friction rub.   No murmur heard. Pulmonary/Chest: Effort normal and breath sounds normal. No respiratory distress. He has no wheezes. He has no rales. He exhibits no tenderness.  Abdominal: Soft. Bowel sounds are normal. He exhibits no distension. There is no tenderness.  Musculoskeletal: Normal range of motion. He exhibits no edema or tenderness.  Lymphadenopathy:    He has no cervical adenopathy.  Neurological: He is alert and oriented to person, place, and time. Coordination normal.  Skin: Skin is warm and dry. No rash noted. No erythema.  Psychiatric: He has a normal mood and affect. His behavior is normal. Judgment and thought content normal.      Assessment and Plan   Nursing note and  vitals reviewed.

## 2014-03-05 NOTE — Assessment & Plan Note (Signed)
Mild chronic shortness of breath with exertion, likely secondary to COPD, obesity, deconditioning

## 2014-03-05 NOTE — Telephone Encounter (Signed)
Pt is calling back with Total Cholesterol 156 , LDL was 83

## 2014-03-07 ENCOUNTER — Encounter (INDEPENDENT_AMBULATORY_CARE_PROVIDER_SITE_OTHER): Payer: Medicare Other

## 2014-03-07 DIAGNOSIS — R0989 Other specified symptoms and signs involving the circulatory and respiratory systems: Secondary | ICD-10-CM

## 2014-03-07 DIAGNOSIS — I714 Abdominal aortic aneurysm, without rupture, unspecified: Secondary | ICD-10-CM

## 2014-03-07 DIAGNOSIS — Z951 Presence of aortocoronary bypass graft: Secondary | ICD-10-CM

## 2014-03-07 DIAGNOSIS — I6523 Occlusion and stenosis of bilateral carotid arteries: Secondary | ICD-10-CM

## 2014-03-07 NOTE — Telephone Encounter (Signed)
Cholesterol is above goal. Previously was 137, now 156 Would increase simvastatin up to 40 mg daily, try to drop the weight Goal LDL less than 70, most recent 83

## 2014-03-07 NOTE — Telephone Encounter (Signed)
Spoke w/ pt.   Advised him of Dr. Donivan Scull recommendation.  He verbalizes understanding and asks that I wait to send in Rx, as he has plenty of 20 mg that he can take until he runs out.  Asked him to call when he is ready for new Rx to be sent.

## 2014-04-01 ENCOUNTER — Encounter: Payer: Self-pay | Admitting: Pulmonary Disease

## 2014-04-02 ENCOUNTER — Telehealth: Payer: Self-pay | Admitting: Pulmonary Disease

## 2014-04-02 NOTE — Telephone Encounter (Signed)
Will send to [facility] to ensure this gets addressed.

## 2014-04-02 NOTE — Telephone Encounter (Signed)
Pt is returning call to lindsey say that you will be receive a fax.Mario Ward

## 2014-04-03 NOTE — Telephone Encounter (Signed)
Caryl Pina, have you received this? Thanks.

## 2014-04-08 NOTE — Telephone Encounter (Signed)
Donna Christen cb to check on order for o2 for pt, states he does not need a cb, just requesting we fax the o2 order so they can get the o2 to the pt.

## 2014-04-09 ENCOUNTER — Telehealth: Payer: Self-pay | Admitting: Pulmonary Disease

## 2014-04-09 NOTE — Telephone Encounter (Signed)
Pt calling to check on the status of order, said to please email him and get the order faxed.Mario Ward

## 2014-04-09 NOTE — Telephone Encounter (Signed)
Will send to [facility] to follow up on [employer] order form this afternoon.  Pt states there is another order that was faxed for nebulizer mouth pieces and this has not been faxed back per pt.  Pt requesting these be signed and faxed back ASAP - pt states that he has been waiting about 1.5-2 weeks for these forms I advised him that as soon as these are completed and faxed back he will get a call letting him know.  Please advise Caryl Pina. Thanks.

## 2014-04-09 NOTE — Telephone Encounter (Signed)
Will route to [place] to follow up on. BQ has not been in the office to sign this.

## 2014-04-09 NOTE — Telephone Encounter (Signed)
This has been re-faxed to [employer] with a confirmation transmission log.

## 2014-04-09 NOTE — Telephone Encounter (Signed)
Mario Ward, please advise if forms for [employer] were faxed back.  Mario Ward, CMA at 04/09/2014 3:03 PM     Status: Signed       Expand All Collapse All   This has been signed and faxed back to [employer].  Pt aware. Nothing further needed.             Mario Ward, CMA at 04/09/2014 10:06 AM     Status: Signed       Expand All Collapse All   Will send to [facility] to follow up on [employer] order form this afternoon.  Pt states there is another order that was faxed for nebulizer mouth pieces and this has not been faxed back per pt.  Pt requesting these be signed and faxed back ASAP - pt states that he has been waiting about 1.5-2 weeks for these forms I advised him that as soon as these are completed and faxed back he will get a call letting him know.  Please advise Mario Ward. Thanks.

## 2014-04-09 NOTE — Telephone Encounter (Signed)
This has been signed and faxed back to [employer].   Pt aware.  Nothing further needed.

## 2014-04-12 ENCOUNTER — Telehealth: Payer: Self-pay | Admitting: Pulmonary Disease

## 2014-04-12 DIAGNOSIS — J438 Other emphysema: Secondary | ICD-10-CM

## 2014-04-12 NOTE — Telephone Encounter (Signed)
Spoke with pt, states he received a POC from [employer]-wants to return the large home concentrator from [facility] in [place]. States with his new poc system he has no need for this.  Order placed to pick up old concentrator.  Nothing further needed.

## 2014-04-23 ENCOUNTER — Ambulatory Visit (INDEPENDENT_AMBULATORY_CARE_PROVIDER_SITE_OTHER): Payer: Medicare Other | Admitting: Pulmonary Disease

## 2014-04-23 ENCOUNTER — Encounter: Payer: Self-pay | Admitting: Pulmonary Disease

## 2014-04-23 VITALS — BP 118/70 | HR 77 | Ht 67.0 in | Wt 252.0 lb

## 2014-04-23 DIAGNOSIS — J432 Centrilobular emphysema: Secondary | ICD-10-CM

## 2014-04-23 DIAGNOSIS — I25708 Atherosclerosis of coronary artery bypass graft(s), unspecified, with other forms of angina pectoris: Secondary | ICD-10-CM

## 2014-04-23 DIAGNOSIS — J9611 Chronic respiratory failure with hypoxia: Secondary | ICD-10-CM | POA: Diagnosis not present

## 2014-04-23 NOTE — Assessment & Plan Note (Signed)
See chronic hypoxemic respiratory failure above.

## 2014-04-23 NOTE — Assessment & Plan Note (Signed)
This has been a stable interval for Mario Ward. Unfortunately he was diagnosed with carcinoid tumor of the duodenum. At this time he does not have wheezing to suggest that he has systemic symptoms from this.  For some reason we still have not received the pulmonary function testing from his prior pulmonologist, we will request this again.  Plan: -Okay from my standpoint to proceed with gastric bypass surgery, his risk is low to moderate at best -Continue Advair and Spiriva -At the time of surgery he should get out of bed as soon as possible, he should use incentive spirometry, and he should use his inhalers (Advair and Spiriva with when necessary albuterol) around the time of surgery -We will request a copy of pulmonary function testing from his prior pulmonologist again.

## 2014-04-23 NOTE — Progress Notes (Signed)
Subjective:    Patient ID: Mario Ward, male    DOB: [birth year], 69 y.o.   MRN: [id number]  Synopsis: COPD, lung function uncertain, previously followed by a pulmonologist in [place].  : HPI Chief Complaint  Patient presents with  . Follow-up    Pt c/o sob with lots of exertion.  Also has sinus congestion, pnd.  states this is baseline for him.    Tamon has been doing OK.  He says that he had an endoscopy and was found to have carcinoid in his duodenal bulb.  He is supposed to undergo a special endoscopy for this at [facility].  He has also been found to have an abdominal aortic aneurysm, they are following this with serial CT scans.  He had his zocor increased recently.  He says his breathing is pretty good except chasing up stairs, but he remains active.  He continues to take his Spiriva and Advair and uses combivent only prn.  He has been using the nebulizer more recently because he feels a little more dyspneic.  He has not been wheezing or coughing more.  He has had a runny nose and congestion lately.  He takes claritin.    Past Medical History  Diagnosis Date  . Hypertension   . Diabetes mellitus without complication   . GERD (gastroesophageal reflux disease)   . COPD (chronic obstructive pulmonary disease)   . Vitamin D deficiency   . Coronary artery disease 1997    CABG x 4   . Sleep apnea     uses a CPAP at night.   . AAA (abdominal aortic aneurysm)       Review of Systems     Objective:   Physical Exam Filed Vitals:   04/23/14 1152  BP: 118/70  Pulse: 77  Height: 5\' 7"  (1.702 m)  Weight: 252 lb (114.306 kg)  SpO2: 96%   RA  Gen: well appearing, no acute distress HEENT: NCAT, EOMi, OP clear, s/p UP3 surgery PULM: CTA B CV: RRR, no mgr, no JVD AB: BS+, soft, nontender Ext: warm, no edema, no clubbing, no cyanosis Derm: no rash or skin breakdown Neuro: A&Ox4, MAEW      Assessment & Plan:   Chronic hypoxemic respiratory failure This has been a  stable interval for Tomaz. Unfortunately he was diagnosed with carcinoid tumor of the duodenum. At this time he does not have wheezing to suggest that he has systemic symptoms from this.  For some reason we still have not received the pulmonary function testing from his prior pulmonologist, we will request this again.  Plan: -Okay from my standpoint to proceed with gastric bypass surgery, his risk is low to moderate at best -Continue Advair and Spiriva -At the time of surgery he should get out of bed as soon as possible, he should use incentive spirometry, and he should use his inhalers (Advair and Spiriva with when necessary albuterol) around the time of surgery -We will request a copy of pulmonary function testing from his prior pulmonologist again.   COPD (chronic obstructive pulmonary disease) See chronic hypoxemic respiratory failure above.     Updated Medication List Outpatient Encounter Prescriptions as of 04/23/2014  Medication Sig  . albuterol (PROVENTIL) (2.5 MG/3ML) 0.083% nebulizer solution Take 2.5 mg by nebulization every 2 (two) hours as needed for wheezing or shortness of breath.  Marland Kitchen aspirin EC 81 MG tablet Take 81 mg by mouth daily.  . clopidogrel (PLAVIX) 75 MG tablet Take 75 mg by mouth daily.  Marland Kitchen  Exenatide ER (BYDUREON) 2 MG PEN Inject 2 mg into the skin once a week.  . Fluticasone-Salmeterol (ADVAIR DISKUS) 250-50 MCG/DOSE AEPB Inhale 1 puff into the lungs 2 (two) times daily.  . hydrochlorothiazide (MICROZIDE) 12.5 MG capsule Take 12.5 mg by mouth daily.  . Ipratropium-Albuterol (COMBIVENT RESPIMAT) 20-100 MCG/ACT AERS respimat Inhale 1 puff into the lungs every 6 (six) hours.  Marland Kitchen lisinopril (PRINIVIL,ZESTRIL) 40 MG tablet Take 40 mg by mouth daily.  . metFORMIN (GLUCOPHAGE) 1000 MG tablet Take 1,000 mg by mouth 2 (two) times daily with a meal.  . metoprolol (LOPRESSOR) 100 MG tablet Take 100 mg by mouth 2 (two) times daily.  . Multiple Vitamin (MULTIVITAMIN) tablet  Take 1 tablet by mouth daily.  . nitroGLYCERIN (NITROSTAT) 0.4 MG SL tablet Place 0.4 mg under the tongue every 5 (five) minutes as needed for chest pain.  . NON FORMULARY Oxygen 2 liters at bedtime.  . NON FORMULARY CPAP at bedtime.  . Olopatadine HCl (PATADAY) 0.2 % SOLN Apply to eye.  . pantoprazole (PROTONIX) 40 MG tablet Take 40 mg by mouth daily.  . simvastatin (ZOCOR) 20 MG tablet Take 40 mg by mouth daily.  . Tiotropium Bromide Monohydrate (SPIRIVA RESPIMAT) 2.5 MCG/ACT AERS Inhale 2 puffs into the lungs daily.

## 2014-04-23 NOTE — Patient Instructions (Signed)
Use Neil Med rinses with distilled water at least twice per day using the instructions on the package. 1/2 hour after using the [facility] Med rinse, use Nasacort two puffs in each nostril once per day.  Remember that the Nasacort can take 1-2 weeks to work after regular use. Use generic zyrtec or claritin or allegra every day.  If this doesn't help, then stop taking it and use chlorpheniramine-phenylephrine combination tablets.  Keep using your inhalers as you are doing  We will see you back in 6 months or sooner if needed

## 2014-05-27 LAB — SURGICAL PATHOLOGY

## 2014-06-13 ENCOUNTER — Encounter: Payer: Self-pay | Admitting: Cardiovascular Disease

## 2014-06-18 ENCOUNTER — Encounter: Payer: Self-pay | Admitting: Cardiovascular Disease

## 2014-06-19 ENCOUNTER — Telehealth: Payer: Self-pay | Admitting: *Deleted

## 2014-06-19 NOTE — Telephone Encounter (Signed)
Request for surgical clearance:  1. What type of surgery is being performed? Bariatric Surgery from [facility]  2.  3. When is this surgery scheduled? May 23 rd   Are there any medications that need to be held prior to surgery and how long? Not sure yet.   4. Name of physician performing surgery? Dr bruce/ [facility]   5. What is your office phone and fax number? Fax: [phone] attn: Sidonie Dickens.  6.  They would like Abdominal imaging report and recent ekg sent as well.

## 2014-06-19 NOTE — Telephone Encounter (Signed)
Patient is cleared for non-cardiac surgery at a moderate to high risk. There is always a risk when holding antiplatelet medication such as Plavix. Pre-op for his procedure he would need to hold for 7 days. If his surgeon is ok with continuing aspirin 81 mg daily would continue this.

## 2014-06-20 NOTE — Telephone Encounter (Signed)
Letter typed and routed.

## 2014-07-23 ENCOUNTER — Telehealth: Payer: Self-pay | Admitting: Pulmonary Disease

## 2014-07-23 DIAGNOSIS — G4733 Obstructive sleep apnea (adult) (pediatric): Secondary | ICD-10-CM

## 2014-07-23 NOTE — Telephone Encounter (Signed)
Spoke with pt, states that [employer] (his cpap dme provider) is going out of business and is wanting to switch to [employer].   Pt is also requesting an order for a new cpap machine.  Dr. Lake Bells it looks like according to past notes, pt was started on cpap  By previous pulmonologist and has not been addressed with you since his initial consult.  Are you ok with ordering pt a new cpap and switching dme?

## 2014-07-23 NOTE — Telephone Encounter (Signed)
I;m OK with that as long as we have records of the polysomnogram.  Please explain to him that due to changes insurance company policies in [phone] his company may not accept my order because I am not a sleep specialist.

## 2014-07-24 NOTE — Telephone Encounter (Signed)
We do not have records of pt's npsg.  Looking at office notes, I've requested these records multiple times from his past pulmonologist and have not received them.  We have an ONO on file and that's it.   [employer] and having the npsg they have on file faxed to our triage fax.  Will await fax.

## 2014-07-25 NOTE — Telephone Encounter (Signed)
Mario Ward, did you receive the records? thanks

## 2014-07-25 NOTE — Telephone Encounter (Signed)
I do not yet have this sleep study.  I checked the triage fax yesterday before leaving, and it is not in by box.

## 2014-07-25 NOTE — Telephone Encounter (Signed)
Sleep study received and placed in BQ's look-at folder for review.

## 2014-07-25 NOTE — Telephone Encounter (Signed)
Per triage protocol, will forward back to nurse to track down results. thanks

## 2014-07-26 NOTE — Telephone Encounter (Signed)
BQ do you want to review this sleep study before reordering cpap, or ok to order?

## 2014-07-29 ENCOUNTER — Encounter: Payer: Self-pay | Admitting: Pulmonary Disease

## 2014-07-29 DIAGNOSIS — Z9989 Dependence on other enabling machines and devices: Secondary | ICD-10-CM

## 2014-07-29 DIAGNOSIS — G4733 Obstructive sleep apnea (adult) (pediatric): Secondary | ICD-10-CM | POA: Insufficient documentation

## 2014-07-30 NOTE — Telephone Encounter (Signed)
Reviewed, OK to order 10cm H20 CPAP

## 2014-07-30 NOTE — Telephone Encounter (Signed)
Order entered for new CPAP.  Patient notified. Nothing further needed.

## 2014-08-16 ENCOUNTER — Telehealth: Payer: Self-pay | Admitting: Pulmonary Disease

## 2014-08-16 DIAGNOSIS — G4733 Obstructive sleep apnea (adult) (pediatric): Secondary | ICD-10-CM

## 2014-08-16 NOTE — Telephone Encounter (Signed)
I called pt. When i asked to speak with pt the person hung the phone up. WCB

## 2014-08-19 NOTE — Telephone Encounter (Signed)
Spoke with pt, states that Mario Ward has told him he isn't "eligible for anything".  Pt is switching to [employer] in [place].  Pt is requesting we re-fax his cpap order to this company.  This has been sent. Nothing further needed at this time.

## 2014-08-28 ENCOUNTER — Other Ambulatory Visit: Payer: Self-pay

## 2014-08-28 MED ORDER — TIOTROPIUM BROMIDE MONOHYDRATE 2.5 MCG/ACT IN AERS: 2.0000 | INHALATION_SPRAY | Freq: Every day | RESPIRATORY_TRACT | Status: DC

## 2014-09-03 ENCOUNTER — Telehealth: Payer: Self-pay | Admitting: Pulmonary Disease

## 2014-09-03 NOTE — Telephone Encounter (Signed)
Spoke with the pt and advised we will have him sign today is possible b/c BQ is back in the office today  Ash, do you have this? If not we can call and req again, thanks!

## 2014-09-03 NOTE — Telephone Encounter (Signed)
I have this form and it has been given to BQ to review and sign.  This will be faxed once I receive it back from him.

## 2014-09-03 NOTE — Telephone Encounter (Signed)
I called and made pt aware of below. Will forward to [facility]

## 2014-09-04 ENCOUNTER — Ambulatory Visit: Payer: Medicare Other | Admitting: Cardiovascular Disease

## 2014-09-04 NOTE — Telephone Encounter (Signed)
Mario Ward, has this been done yet?

## 2014-09-04 NOTE — Telephone Encounter (Signed)
I have not yet received this back from BQ.  This message is being held in my box and will be signed off once I received and fax this order.

## 2014-09-05 NOTE — Telephone Encounter (Signed)
Signed.  On my desk. 

## 2014-09-06 NOTE — Telephone Encounter (Signed)
Form has been signed by BQ and faxed back with a confirmation fax received.   atc pt to make aware, no answer, no vm set up yet. wcb

## 2014-09-09 NOTE — Telephone Encounter (Signed)
Called and spoke to pt. Informed him the form has been faxed back and to contact us back if he still does not hear from DME regarding CPAP. Pt verbalized understanding and denied any further questions or concerns at this time.

## 2014-09-20 ENCOUNTER — Encounter: Payer: Self-pay | Admitting: Pulmonary Disease

## 2014-09-20 ENCOUNTER — Ambulatory Visit (INDEPENDENT_AMBULATORY_CARE_PROVIDER_SITE_OTHER): Payer: Medicare Other | Admitting: Pulmonary Disease

## 2014-09-20 VITALS — BP 120/78 | HR 60 | Ht 67.0 in | Wt 213.0 lb

## 2014-09-20 DIAGNOSIS — I25708 Atherosclerosis of coronary artery bypass graft(s), unspecified, with other forms of angina pectoris: Secondary | ICD-10-CM

## 2014-09-20 DIAGNOSIS — J449 Chronic obstructive pulmonary disease, unspecified: Secondary | ICD-10-CM

## 2014-09-20 DIAGNOSIS — E669 Obesity, unspecified: Secondary | ICD-10-CM | POA: Diagnosis not present

## 2014-09-20 DIAGNOSIS — G4733 Obstructive sleep apnea (adult) (pediatric): Secondary | ICD-10-CM

## 2014-09-20 NOTE — Patient Instructions (Signed)
We will help get [facility] as your new supplier of CPAP device and supplies In May of 2017 we will plan repeat sleep study to see if you still need CPAP You may try off the Advair inhaler and if no worsening of your breathing after 2 weeks or so, you may try off Spiriva We discussed lung cancer screening with low dose CT scan of the chest and we decided to hold off on this for now. We will discuss further in the future Follow up with me 3 months  Keep up the good work  Merton Border, MD

## 2014-09-20 NOTE — Progress Notes (Signed)
PROBLEMS: OSA COPD Obesity - underwent gastric bypass surgery 06/2014  INTERVAL HISTORY: Since last seen, underwent gastric bypass surgery and has lost 58 lbs  SUBJ: No new complaints. No dyspnea or other respiratory symptoms. Sleeping well. Continues to use CPAP with nocturnal O2. The company that has supplied his CPAP machine no longer is active in [place] and he asks that we facilitate a change to another company. Wonders whether we can discontinue one or both of his maintenance MDIs  OBJ: Pleasant NAD HEENT without acute findings No JVD Chest with full BS and no adventitious sounds Mildly obese, +BS No LE edema Neuro grossly intact  DATA:   IMPRESSION: Obesity - improving after gastric bypass surgery OSA - seems well controlled. Wishes to continue CPAP for a full year after surgery with plan to repeat polysomnogram in summer of 2/17 COPD - stable and likely little reversibilty  PLAN: We will help get [facility] as new supplier of CPAP device and supplies In May of 2017 we will plan repeat sleep study to see if he still needs CPAP Ttry off the Advair inhaler and if no worsening after 2 weeks or so, try off Spiriva We discussed lung cancer screening with low dose CT scan of the chest and we decided to hold off on this for now. We will discuss again further in the future Follow up with me 3 months    Wilhelmina Mcardle, MD [place] [facility]

## 2014-10-01 ENCOUNTER — Encounter: Payer: Self-pay | Admitting: Cardiovascular Disease

## 2014-10-01 ENCOUNTER — Ambulatory Visit (INDEPENDENT_AMBULATORY_CARE_PROVIDER_SITE_OTHER): Payer: Medicare Other | Admitting: Cardiovascular Disease

## 2014-10-01 VITALS — BP 100/62 | HR 80 | Ht 71.0 in | Wt 211.5 lb

## 2014-10-01 DIAGNOSIS — E785 Hyperlipidemia, unspecified: Secondary | ICD-10-CM | POA: Diagnosis not present

## 2014-10-01 DIAGNOSIS — J432 Centrilobular emphysema: Secondary | ICD-10-CM | POA: Diagnosis not present

## 2014-10-01 DIAGNOSIS — I25708 Atherosclerosis of coronary artery bypass graft(s), unspecified, with other forms of angina pectoris: Secondary | ICD-10-CM

## 2014-10-01 DIAGNOSIS — I10 Essential (primary) hypertension: Secondary | ICD-10-CM | POA: Diagnosis not present

## 2014-10-01 DIAGNOSIS — R0602 Shortness of breath: Secondary | ICD-10-CM

## 2014-10-01 MED ORDER — SIMVASTATIN 40 MG PO TABS: 40.0000 mg | ORAL_TABLET | Freq: Every day | ORAL | Status: DC

## 2014-10-01 NOTE — Assessment & Plan Note (Signed)
Prior shortness of breath symptoms have dramatically improved with weight loss No further testing at this time

## 2014-10-01 NOTE — Assessment & Plan Note (Signed)
Currently with no symptoms of angina. No further workup at this time. Continue current medication regimen. 

## 2014-10-01 NOTE — Assessment & Plan Note (Addendum)
Blood pressure is well controlled on today's visit. No changes made to the medications. We have recommended if blood pressure drops with further weight loss, he could decrease the lisinopril down to 20 mg daily

## 2014-10-01 NOTE — Patient Instructions (Signed)
You are doing well. No medication changes were made.  If blood pressure runs low, Cut the lisinopril in 1/2 daily  Please call us if you have new issues that need to be addressed before your next appt.  Your physician wants you to follow-up in: 6 months.  You will receive a reminder letter in the mail two months in advance. If you don't receive a letter, please call our office to schedule the follow-up appointment.

## 2014-10-01 NOTE — Assessment & Plan Note (Signed)
He has had dramatic improvement in his shortness of breath with his weight loss. Encouraged him to continue slow continued weight loss

## 2014-10-01 NOTE — Assessment & Plan Note (Signed)
Recent gastric bypass surgery, 40 pound weight loss

## 2014-10-01 NOTE — Assessment & Plan Note (Signed)
Cholesterol is at goal on the current lipid regimen. No changes to the medications were made.  

## 2014-10-01 NOTE — Progress Notes (Signed)
Patient ID: Mario Ward, male    DOB: September 26, 1945, 69 y.o.   MRN: [id number]  HPI Comments: Mario Ward is a 69 year old gentleman with a history of coronary artery disease, bypass x4 on 02/08/1995, history of diabetes, long history of smoking for 40 years stopped 7 years ago, obesity, obstructive sleep apnea who is compliant with his CPAP, hyperlipidemia who presents for routine follow up of his coronary artery disease. 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 Previously seen by Dr. Lake Bells.   05/02/2013, He developed chest discomfort, malaise, did not feel well. His wife called 46 and he was initially taken to a hospital in [place], and transferred to [facility] where he underwent cardiac catheterization  Cardiac catheterization showed severe three-vessel coronary artery disease. He had an occluded RCA, occluded mid LAD, occluded marginal branch off the circumflex,  He had a vein graft to the diagonal, vein graft to the OM, occluded vein graft to the RCA, patent LIMA to the LAD. Medical management was recommended Ejection fraction estimated at 55%, normal wall motion.   Previous lab work; Hemoglobin A1c 6.4, low vitamin D, total cholesterol 137, LDL 65, HDL 37, triglycerides in the 170  range Bypass surgery was performed at [facility] in [facility]    No Known Allergies  Outpatient Encounter Prescriptions as of 10/01/2014  Medication Sig  . albuterol (PROVENTIL) (2.5 MG/3ML) 0.083% nebulizer solution Take 2.5 mg by nebulization every 2 (two) hours as needed for wheezing or shortness of breath.  Marland Kitchen aspirin EC 81 MG tablet Take 81 mg by mouth daily.  . cholecalciferol (VITAMIN D) 1000 UNITS tablet Take 2,000 Units by mouth daily.  . clopidogrel (PLAVIX) 75 MG tablet Take 75 mg by mouth daily.  . Ipratropium-Albuterol (COMBIVENT RESPIMAT) 20-100 MCG/ACT AERS respimat Inhale 1 puff into the lungs every 6 (six) hours.  Marland Kitchen lisinopril (PRINIVIL,ZESTRIL) 40 MG tablet Take 40 mg by mouth daily.  . metoprolol tartrate (LOPRESSOR) 25 MG tablet Take 25 mg by mouth 2 (two) times daily.   . Multiple Vitamin (MULTIVITAMIN) tablet Take 1 tablet by mouth daily.  . nitroGLYCERIN (NITROSTAT) 0.4 MG SL tablet Place 0.4 mg under the tongue every 5 (five) minutes as needed for chest pain.  . NON FORMULARY Oxygen 2 liters at bedtime.  . NON FORMULARY CPAP at bedtime.  . Olopatadine HCl (PATADAY) 0.2 % SOLN Apply to eye.  . RABEprazole (ACIPHEX) 20 MG tablet Take 20 mg by mouth daily.  . simvastatin (ZOCOR) 40 MG tablet Take 1 tablet (40 mg total) by mouth daily.  . Tiotropium Bromide Monohydrate (SPIRIVA RESPIMAT) 2.5 MCG/ACT AERS Inhale 2 puffs into the lungs daily.  . vitamin B-12 (CYANOCOBALAMIN) 1000 MCG tablet Take 1,000 mcg by mouth daily.  . [DISCONTINUED] simvastatin (ZOCOR) 20 MG tablet Take 40 mg by mouth daily.  . [  DISCONTINUED] Fluticasone-Salmeterol (ADVAIR DISKUS) 250-50 MCG/DOSE AEPB Inhale 1 puff into the lungs 2 (two) times daily.  . [DISCONTINUED] metoprolol (LOPRESSOR) 100 MG tablet Take 100 mg by mouth 2 (two) times daily.   No facility-administered encounter medications on file as of 10/01/2014.    Past Medical History  Diagnosis Date  .  Hypertension   . Diabetes mellitus without complication   . GERD (gastroesophageal reflux disease)   . COPD (chronic obstructive pulmonary disease)   . Vitamin D deficiency   . Coronary artery disease 1997    CABG x 4   . Sleep apnea     uses a CPAP at night.   . AAA (abdominal aortic aneurysm)     Past Surgical History  Procedure Laterality Date  . Replacement total knee      right  . Coronary artery bypass graft  1997    CABG x 4 in [place]   . Cardiac catheterization  05/03/2013    [facility] in [place]  . Bariatric surgery      Social History  reports that he quit smoking about 8 years ago. His smoking use included Cigarettes. He has a 40 pack-year smoking history. He has never used smokeless tobacco. He reports that he does not drink alcohol or use illicit drugs.  Family History family history includes Heart attack (age of onset: 74) in his brother; Heart attack (age of onset: 70) in his father; Heart attack (age of onset: 72) in his mother; Heart disease in his brother, father, and mother.   Review of Systems  Constitutional: Positive for unexpected weight change.  Respiratory: Negative.   Cardiovascular: Negative.   Gastrointestinal: Negative.   Musculoskeletal: Negative.   Neurological: Negative.   Hematological: Negative.   Psychiatric/Behavioral: Negative.   All other systems reviewed and are negative.   BP 100/62 mmHg  Pulse 80  Ht 5\' 11"  (1.803 m)  Wt 211 lb 8 oz (95.936 kg)  BMI 29.51 kg/m2  Physical Exam  Constitutional: He is oriented to person, place, and time. He appears well-developed and well-nourished.  Morbidly Obese  HENT:  Head: Normocephalic.  Nose: Nose normal.  Mouth/Throat: Oropharynx is clear and moist.  Eyes: Conjunctivae are normal. Pupils are equal, round, and reactive to light.  Neck: Normal range of motion. Neck supple. No JVD present.  Cardiovascular: Normal rate, regular rhythm, S1 normal, S2 normal, normal heart sounds and  intact distal pulses.  Exam reveals no gallop and no friction rub.   No murmur heard. Pulmonary/Chest: Effort normal and breath sounds normal. No respiratory distress. He has no wheezes. He has no rales. He exhibits no tenderness.  Abdominal: Soft. Bowel sounds are normal. He exhibits no distension. There is no tenderness.  Musculoskeletal: Normal range of motion. He exhibits no edema or tenderness.  Lymphadenopathy:    He has no cervical adenopathy.  Neurological: He is alert and oriented to person, place, and time. Coordination normal.  Skin: Skin is warm and dry. No rash noted. No erythema.  Psychiatric: He has a normal mood and affect. His behavior is normal. Judgment and thought content normal.      Assessment and Plan   Nursing note and vitals reviewed.

## 2014-10-12 ENCOUNTER — Encounter: Payer: Self-pay | Admitting: Pulmonary Disease

## 2014-10-14 NOTE — Telephone Encounter (Addendum)
Patient is leaving for vacation and wants to know if he can use the CPAP with out oxygen during his trip. Patient will be driving, not flying.. Leaving this Saturday and staying until October 5th Patient will be traveling to [place], [place] and [place]  Dr. Alva Garnet, please advise.

## 2014-12-19 ENCOUNTER — Ambulatory Visit (INDEPENDENT_AMBULATORY_CARE_PROVIDER_SITE_OTHER): Payer: Medicare Other | Admitting: Pulmonary Disease

## 2014-12-19 ENCOUNTER — Encounter: Payer: Self-pay | Admitting: Pulmonary Disease

## 2014-12-19 VITALS — BP 124/80 | HR 66 | Ht 67.0 in | Wt 208.0 lb

## 2014-12-19 DIAGNOSIS — J449 Chronic obstructive pulmonary disease, unspecified: Secondary | ICD-10-CM

## 2014-12-19 DIAGNOSIS — G4733 Obstructive sleep apnea (adult) (pediatric): Secondary | ICD-10-CM | POA: Diagnosis not present

## 2014-12-22 NOTE — Progress Notes (Signed)
PROBLEMS: OSA COPD Obesity - underwent gastric bypass surgery 06/2014  INTERVAL HISTORY: Further weight loss. Total of approx 70# wt loss since surgery 05/16  SUBJ: No new complaints. No dyspnea or other respiratory symptoms. No change in respiratory symptoms off of Advair and Spiriva (I suggested a trial of sequential stoppage to assess whether these meds were offering any benefit). He asks that we perform overnight oximetry on CPAP but off O2 to assess whether he still needs nocturnal O2  OBJ: Filed Vitals:   12/19/14 1130  BP: 124/80  Pulse: 66  Height: 5\' 7"  (1.702 m)  Weight: 208 lb (94.348 kg)  SpO2: 93%    Pleasant NAD HEENT without acute findings No JVD Chest with full BS and no adventitious sounds Mildly obese, +BS No LE edema Neuro grossly intact  DATA:   IMPRESSION: Obesity - improving after gastric bypass surgery OSA - well controlled presently on CPAP and O2 bleed in. Might no longer require O2. Wishes to continue CPAP for a full year after surgery with plan to repeat polysomnogram in summer of 2/17 COPD - stable. No evidence of reversible component. No worsening of symptoms off Advair and Spiriva  PLAN: I congratulated him on his excellent success @ weight loss. He is to continue his current efforts Cont CPAP with plan of follow up PSG in summer 2017 Overnight oximetry ordered to consider discontinuation of nocturnal O2 Cont off bronchodilator therapies Follow up in 3 months or sooner PRN   Wilhelmina Mcardle, MD [facility]  Pulmonary/CCM

## 2014-12-30 ENCOUNTER — Ambulatory Visit: Admission: RE | Admit: 2014-12-30 | Payer: Medicare Other | Source: Ambulatory Visit | Admitting: Gastroenterology

## 2014-12-30 ENCOUNTER — Encounter: Admission: RE | Payer: Self-pay | Source: Ambulatory Visit

## 2014-12-30 SURGERY — ESOPHAGOGASTRODUODENOSCOPY (EGD) WITH PROPOFOL
Anesthesia: General

## 2014-12-31 ENCOUNTER — Telehealth: Payer: Self-pay | Admitting: Pulmonary Disease

## 2014-12-31 NOTE — Telephone Encounter (Signed)
Patient has now spoken to sleep center that is providing him with his O2 machine so he does not need a call back from our office.

## 2015-01-02 ENCOUNTER — Encounter: Payer: Self-pay | Admitting: Pulmonary Disease

## 2015-01-19 ENCOUNTER — Encounter: Payer: Self-pay | Admitting: Pulmonary Disease

## 2015-01-20 NOTE — Telephone Encounter (Signed)
Patient requesting results of ONO.   Please advise.

## 2015-01-23 ENCOUNTER — Telehealth: Payer: Self-pay | Admitting: Pulmonary Disease

## 2015-01-23 NOTE — Telephone Encounter (Signed)
Received MyChart message from patient requesting ONO results. Patient says he has been waiting for several weeks for results and no one has called him with results. He would like results ASAP.  To Misty/Dr. Alva Garnet:  Please advise on ONO results.

## 2015-01-23 NOTE — Telephone Encounter (Signed)
Per KK pt didn't desat enough for O2. He desated from 95% to 92%. O2 not needed. States to schedule f/u appt with DS.    Wife states pt not home. Will try back later.

## 2015-01-23 NOTE — Telephone Encounter (Signed)
Sent results to patient via MyChart (pt's preferred method of communication) Advised patient to contact [place] office to schedule follow up appointment with Dr. Alva Garnet. Gave patient Prue phone number to call their office to schedule appt.

## 2015-01-23 NOTE — Telephone Encounter (Signed)
FYI: Dr. Alva Garnet;  Papers in your folder.

## 2015-01-24 NOTE — Telephone Encounter (Signed)
Patient requesting copy of ONO results to be mailed to him. Copy has not been scanned into chart yet, so sending to [facility] to make copy and mail to patient.  Thanks [employer]

## 2015-01-29 NOTE — Telephone Encounter (Signed)
I am having DR to look at it today before he leaves since DS didn't advise on it. I will be calling pt this afternoon with results and will mail copy to pt.

## 2015-01-29 NOTE — Telephone Encounter (Signed)
Still do not see ONO in chart. Please advise Misty thanks

## 2015-01-29 NOTE — Telephone Encounter (Signed)
Pt informed of results. Copy of results mailed to pt per his request. Nothing further needed.

## 2015-02-17 ENCOUNTER — Other Ambulatory Visit: Payer: Self-pay | Admitting: Vascular Surgery

## 2015-02-17 DIAGNOSIS — I714 Abdominal aortic aneurysm, without rupture, unspecified: Secondary | ICD-10-CM

## 2015-02-24 ENCOUNTER — Ambulatory Visit
Admission: RE | Admit: 2015-02-24 | Discharge: 2015-02-24 | Disposition: A | Discharge status: Home / Self Care | Payer: Medicare Other | Source: Ambulatory Visit | Attending: Vascular Surgery | Admitting: Vascular Surgery

## 2015-02-24 DIAGNOSIS — I714 Abdominal aortic aneurysm, without rupture, unspecified: Secondary | ICD-10-CM

## 2015-02-24 DIAGNOSIS — Z9884 Bariatric surgery status: Secondary | ICD-10-CM | POA: Diagnosis not present

## 2015-02-24 DIAGNOSIS — I723 Aneurysm of iliac artery: Secondary | ICD-10-CM | POA: Insufficient documentation

## 2015-02-24 MED ORDER — IOHEXOL 350 MG/ML SOLN
125.0000 mL | Freq: Once | INTRAVENOUS | Status: AC | PRN
  Administered 2015-02-24: 125 mL via INTRAVENOUS

## 2015-03-04 ENCOUNTER — Ambulatory Visit: Payer: Medicare Other | Admitting: Nurse Practitioner

## 2015-03-14 ENCOUNTER — Ambulatory Visit (INDEPENDENT_AMBULATORY_CARE_PROVIDER_SITE_OTHER): Payer: Medicare Other | Admitting: Cardiovascular Disease

## 2015-03-14 ENCOUNTER — Encounter: Payer: Self-pay | Admitting: Cardiovascular Disease

## 2015-03-14 ENCOUNTER — Ambulatory Visit: Payer: Medicare Other | Admitting: Physician Assistant

## 2015-03-14 VITALS — BP 130/68 | HR 77 | Ht 67.0 in | Wt 213.0 lb

## 2015-03-14 DIAGNOSIS — E118 Type 2 diabetes mellitus with unspecified complications: Secondary | ICD-10-CM

## 2015-03-14 DIAGNOSIS — Z01810 Encounter for preprocedural cardiovascular examination: Secondary | ICD-10-CM

## 2015-03-14 DIAGNOSIS — Z951 Presence of aortocoronary bypass graft: Secondary | ICD-10-CM

## 2015-03-14 DIAGNOSIS — I25708 Atherosclerosis of coronary artery bypass graft(s), unspecified, with other forms of angina pectoris: Secondary | ICD-10-CM | POA: Diagnosis not present

## 2015-03-14 DIAGNOSIS — E785 Hyperlipidemia, unspecified: Secondary | ICD-10-CM

## 2015-03-14 DIAGNOSIS — I10 Essential (primary) hypertension: Secondary | ICD-10-CM

## 2015-03-14 DIAGNOSIS — Z9989 Dependence on other enabling machines and devices: Secondary | ICD-10-CM

## 2015-03-14 DIAGNOSIS — G4733 Obstructive sleep apnea (adult) (pediatric): Secondary | ICD-10-CM

## 2015-03-14 NOTE — Assessment & Plan Note (Signed)
We have encouraged continued exercise, careful diet management in an effort to lose weight. Most recent hemoglobin A1c not available

## 2015-03-14 NOTE — Patient Instructions (Signed)
You are doing well. No medication changes were made.  Please call us if you have new issues that need to be addressed before your next appt.  Your physician wants you to follow-up in: 6 months.  You will receive a reminder letter in the mail two months in advance. If you don't receive a letter, please call our office to schedule the follow-up appointment.   

## 2015-03-14 NOTE — Assessment & Plan Note (Signed)
Reports he has been seen by Dr. Jamal Collin,  he is attempting to wean off CPAP in the next month Was previously using nocturnal oxygen, no longer uses this

## 2015-03-14 NOTE — Assessment & Plan Note (Signed)
Currently with no symptoms of angina. No further workup at this time. Continue current medication regimen. 

## 2015-03-14 NOTE — Assessment & Plan Note (Addendum)
Acceptable risk for upcoming AAA endograft stent placement Suggested he stay on his aspirin and Plavix for the procedure Would be okay to stop the Plavix 5 days before his endoscopy. Suggested he discuss this with Dr. Ronalee Belts Would stay on his aspirin No further testing needed prior to the procedure   Total encounter time more than 25 minutes  Greater than 50% was spent in counseling and coordination of care with the patient

## 2015-03-14 NOTE — Assessment & Plan Note (Signed)
Blood pressure is well controlled on today's visit. No changes made to the medications. 

## 2015-03-14 NOTE — Progress Notes (Signed)
Patient ID: Mario Ward, male    DOB: 08/20/1945, 70 y.o.   MRN: [id number]  HPI Comments: Mario Ward is a 70 year old gentleman with a history of coronary artery disease, bypass x4 on 02/08/1995, history of diabetes, long history of smoking for 40 years stopped 72 years ago, obesity, obstructive sleep apnea who is compliant with his CPAP, hyperlipidemia who presents for routine follow up of his coronary artery disease. Last cardiac catheterization April 2015 4.8 cm AAA  On his visit today, he reports that he is scheduled to have AAA stent placed 04/02/2015 Several weeks after that he is scheduled for upper endoscopy at [facility] for his well-differentiated neuroendocrine tumor, carcinoid tumor  He reports that he is very active, no shortness of breath, no chest pain on exertion He has a regular exercise program, does 10,000 steps per day using his fit bit  no significant lower extremity edema, Recent cortisone shot to the right knee, large region of ecchymosis and tenderness  EKG on today's visit shows normal sinus rhythm with rate 77 bpm, right bundle branch block  Other past medical history  prior cardiac catheterization in April 2015. Previously seen by Dr. Lake Bells.   05/02/2013, He developed chest discomfort, malaise, did not feel well. His wife called 1 and he was initially taken to a hospital in [place], and transferred to [facility] where he underwent cardiac catheterization  Cardiac catheterization showed severe three-vessel coronary artery disease. He had an occluded RCA, occluded mid LAD, occluded marginal branch off the circumflex,  He had a vein graft to the diagonal, vein graft to the OM, occluded vein graft to the RCA, patent LIMA to the LAD. Medical management was recommended Ejection fraction estimated at 55%, normal wall motion.   Previous lab work; Hemoglobin A1c 6.4, low vitamin D, total cholesterol 137, LDL 65, HDL 37, triglycerides in the 170 range Bypass  surgery was performed at [facility] in [facility]    No Known Allergies  Outpatient Encounter Prescriptions as of 03/14/2015  Medication Sig  . albuterol (PROVENTIL) (2.5 MG/3ML) 0.083% nebulizer solution Take 2.5 mg by nebulization every 2 (two) hours as needed for wheezing or shortness of breath.  Marland Kitchen aspirin EC 81 MG tablet Take 81 mg by mouth daily.  . cholecalciferol (VITAMIN D) 1000 UNITS tablet Take 2,000 Units by mouth daily.  . clopidogrel (PLAVIX) 75 MG tablet Take 75 mg by mouth daily.  . Ipratropium-Albuterol (COMBIVENT RESPIMAT) 20-100 MCG/ACT AERS respimat Inhale 1 puff into the lungs every 6 (six) hours.  Marland Kitchen lisinopril (PRINIVIL,ZESTRIL) 40 MG tablet Take 40 mg by mouth daily.  Marland Kitchen loratadine (CLARITIN) 10 MG tablet Take 10 mg by mouth daily.  . metoprolol tartrate (LOPRESSOR) 25 MG tablet Take 25 mg by mouth 2 (two) times daily.   . Multiple Vitamin (MULTIVITAMIN) tablet Take 1 tablet by mouth daily.  . nitroGLYCERIN (NITROSTAT) 0.4 MG SL tablet Place 0.4 mg under the tongue every 5 (five) minutes as needed for chest pain.  . NON FORMULARY CPAP at bedtime.  . Olopatadine HCl (PATADAY) 0.2 % SOLN Apply to eye.  . RABEprazole (ACIPHEX) 20 MG tablet Take 20 mg by mouth daily.  . simvastatin (ZOCOR) 40 MG tablet Take 1 tablet (40 mg total) by mouth daily.  . vitamin B-12 (CYANOCOBALAMIN) 1000 MCG tablet Take 1,000 mcg by mouth daily.  . [DISCONTINUED] NON FORMULARY Reported on 03/14/2015   No facility-administered encounter medications on file as of 03/14/2015.    Past Medical  History  Diagnosis Date  . Hypertension   . GERD (gastroesophageal reflux disease)   . COPD (chronic obstructive pulmonary disease) ([place])   . Vitamin D deficiency   . Coronary artery disease 1997    CABG x 4   . Sleep apnea     uses a CPAP at night.   . AAA (abdominal aortic aneurysm) ([place])   . Diabetes mellitus without complication [facility])     Patient states he had Gastric  Bypass and is no longer on meds.    Past Surgical History  Procedure Laterality Date  . Replacement total knee      right  . Coronary artery bypass graft  1997    CABG x 4 in [place]   . Cardiac catheterization  05/03/2013    [facility] in [place]  . Bariatric surgery      Social History  reports that he quit smoking about 8 years ago. His smoking use included Cigarettes. He has a 40 pack-year smoking history. He has never used smokeless tobacco. He reports that he does not drink alcohol or use illicit drugs.  Family History family history includes Heart attack (age of onset: 26) in his brother; Heart attack (age of onset: 1) in his father; Heart attack (age of onset: 33) in his mother; Heart disease in his brother, father, and mother.   Review of Systems  Constitutional: Negative.   Respiratory: Negative.   Cardiovascular: Negative.   Gastrointestinal: Negative.   Musculoskeletal: Negative.   Neurological: Negative.   Hematological: Negative.   Psychiatric/Behavioral: Negative.   All other systems reviewed and are negative.   BP 130/68 mmHg  Pulse 77  Ht 5\' 7"  (1.702 m)  Wt 213 lb (96.616 kg)  BMI 33.35 kg/m2  Physical Exam  Constitutional: He is oriented to person, place, and time. He appears well-developed and well-nourished.  Obese  HENT:  Head: Normocephalic.  Nose: Nose normal.  Mouth/Throat: Oropharynx is clear and moist.  Eyes: Conjunctivae are normal. Pupils are equal, round, and reactive to light.  Neck: Normal range of motion. Neck supple. No JVD present.  Cardiovascular: Normal rate, regular rhythm, S1 normal, S2 normal and intact distal pulses.  Exam reveals no gallop and no friction rub.   Murmur heard.  Systolic murmur is present with a grade of 1/6  Pulmonary/Chest: Effort normal and breath sounds normal. No respiratory distress. He has no wheezes. He has no rales. He exhibits no tenderness.  Abdominal: Soft. Bowel sounds are normal. He exhibits no  distension. There is no tenderness.  Musculoskeletal: Normal range of motion. He exhibits no edema or tenderness.  Lymphadenopathy:    He has no cervical adenopathy.  Neurological: He is alert and oriented to person, place, and time. Coordination normal.  Skin: Skin is warm and dry. No rash noted. No erythema.  Psychiatric: He has a normal mood and affect. His behavior is normal. Judgment and thought content normal.      Assessment and Plan   Nursing note and vitals reviewed.

## 2015-03-14 NOTE — Assessment & Plan Note (Signed)
Cholesterol is at goal on the current lipid regimen. No changes to the medications were made.  

## 2015-03-24 ENCOUNTER — Other Ambulatory Visit: Payer: Self-pay | Admitting: Vascular Surgery

## 2015-03-26 ENCOUNTER — Ambulatory Visit (INDEPENDENT_AMBULATORY_CARE_PROVIDER_SITE_OTHER): Payer: Medicare Other | Admitting: Pulmonary Disease

## 2015-03-26 ENCOUNTER — Encounter: Payer: Self-pay | Admitting: Pulmonary Disease

## 2015-03-26 VITALS — BP 130/82 | HR 67 | Ht 67.0 in | Wt 212.4 lb

## 2015-03-26 DIAGNOSIS — G4733 Obstructive sleep apnea (adult) (pediatric): Secondary | ICD-10-CM

## 2015-03-27 ENCOUNTER — Ambulatory Visit
Admission: RE | Admit: 2015-03-27 | Discharge: 2015-03-27 | Disposition: A | Discharge status: Home / Self Care | Payer: Medicare Other | Source: Ambulatory Visit | Attending: Vascular Surgery | Admitting: Vascular Surgery

## 2015-03-27 ENCOUNTER — Encounter
Admission: RE | Admit: 2015-03-27 | Discharge: 2015-03-27 | Disposition: A | Discharge status: Home / Self Care | Payer: Medicare Other | Source: Ambulatory Visit | Attending: Vascular Surgery | Admitting: Vascular Surgery

## 2015-03-27 DIAGNOSIS — R918 Other nonspecific abnormal finding of lung field: Secondary | ICD-10-CM | POA: Diagnosis not present

## 2015-03-27 DIAGNOSIS — Z01818 Encounter for other preprocedural examination: Secondary | ICD-10-CM

## 2015-03-27 DIAGNOSIS — Z951 Presence of aortocoronary bypass graft: Secondary | ICD-10-CM | POA: Insufficient documentation

## 2015-03-27 HISTORY — DX: Angina pectoris, unspecified: I20.9

## 2015-03-27 HISTORY — DX: Unspecified osteoarthritis, unspecified site: M19.90

## 2015-03-27 LAB — CBC WITH DIFFERENTIAL/PLATELET
BASOS PCT: 1 %
Basophils Absolute: 0.1 10*3/uL (ref 0–0.1)
EOS ABS: 0.1 10*3/uL (ref 0–0.7)
Eosinophils Relative: 2 %
HCT: 39.7 % — ABNORMAL LOW (ref 40.0–52.0)
Hemoglobin: 13.6 g/dL (ref 13.0–18.0)
Lymphocytes Relative: 26 %
Lymphs Abs: 1.3 10*3/uL (ref 1.0–3.6)
MCH: 29.9 pg (ref 26.0–34.0)
MCHC: 34.2 g/dL (ref 32.0–36.0)
MCV: 87.2 fL (ref 80.0–100.0)
MONO ABS: 0.6 10*3/uL (ref 0.2–1.0)
MONOS PCT: 11 %
Neutro Abs: 3 10*3/uL (ref 1.4–6.5)
Neutrophils Relative %: 60 %
Platelets: 219 10*3/uL (ref 150–440)
RBC: 4.56 MIL/uL (ref 4.40–5.90)
RDW: 13.4 % (ref 11.5–14.5)
WBC: 5 10*3/uL (ref 3.8–10.6)

## 2015-03-27 LAB — TYPE AND SCREEN
ABO/RH(D): O POS
Antibody Screen: NEGATIVE

## 2015-03-27 LAB — PROTIME-INR
INR: 1.1
PROTHROMBIN TIME: 14.4 s (ref 11.4–15.0)

## 2015-03-27 LAB — BASIC METABOLIC PANEL
Anion gap: 8 (ref 5–15)
BUN: 22 mg/dL — AB (ref 6–20)
CALCIUM: 9.4 mg/dL (ref 8.9–10.3)
CO2: 23 mmol/L (ref 22–32)
CREATININE: 0.68 mg/dL (ref 0.61–1.24)
Chloride: 106 mmol/L (ref 101–111)
GFR calc Af Amer: >60 mL/min (ref >60)
GFR calc non Af Amer: >60 mL/min (ref >60)
GLUCOSE: 137 mg/dL — AB (ref 65–99)
Potassium: 3.9 mmol/L (ref 3.5–5.1)
Sodium: 137 mmol/L (ref 135–145)

## 2015-03-27 LAB — APTT: aPTT: 33 seconds (ref 24–36)

## 2015-03-27 LAB — SURGICAL PCR SCREEN
MRSA, PCR: NEGATIVE
STAPHYLOCOCCUS AUREUS: NEGATIVE

## 2015-03-27 NOTE — Patient Instructions (Signed)
  Your procedure is scheduled on: April 02, 2015 (Wednesday) Report to Special Procedures.[facility] To find out your arrival time please call [phone] between 1PM - 3PM on ARRIVAL TIME 6;30 AM.  Remember: Instructions that are not followed completely may result in serious medical risk, up to and including death, or upon the discretion of your surgeon and anesthesiologist your surgery may need to be rescheduled.    __x__ 1. Do not eat food or drink liquids after midnight. No gum chewing or hard candies.     __x__ 2. No Alcohol for 24 hours before or after surgery.   ____ 3. Bring all medications with you on the day of surgery if instructed.    __x__ 4. Notify your doctor if there is any change in your medical condition     (cold, fever, infections).     Do not wear jewelry, make-up, hairpins, clips or nail polish.  Do not wear lotions, powders, or perfumes. You may wear deodorant.  Do not shave 48 hours prior to surgery. Men may shave face and neck.  Do not bring valuables to the hospital.    [facility] is not responsible for any belongings or valuables.               Contacts, dentures or bridgework may not be worn into surgery.  Leave your suitcase in the car. After surgery it may be brought to your room.  For patients admitted to the hospital, discharge time is determined by your                treatment team.   Patients discharged the day of surgery will not be allowed to drive home.   Please read over the following fact sheets that you were given:   MRSA Information and Surgical Site Infection Prevention   ____ Take these medicines the morning of surgery with A SIP OF WATER:    1. Lisinopril   2. Metoprolol  3. Aciphex (Aciphex at bedtime on February 28)  4.  5.  6.  ____ Fleet Enema (as directed)   _x___ Use CHG Soap as directed  __x__ Use inhalers on the day of surgery (Use Albuterol Nebulizer and Combivent inhaler the morning of  Surgery and bring  Combivent inhaler to hospital)  ____ Stop metformin 2 days prior to surgery    ____ Take 1/2 of usual insulin dose the night before surgery and none on the morning of surgery.   _x___ Stop Coumadin/Plavix/aspirin on (Follow Dr. Delana Meyer and Dr. Rockey Situ instructions regarding Aspirin and Plavix)  _x___ Stop Anti-inflammatories on (NO NSAIDS)   _x_ Stop supplements until after surgery.  (Stop Vitamin B-12 now)  _x__ Bring C-Pap to the hospital.

## 2015-03-28 LAB — ABO/RH: ABO/RH(D): O POS

## 2015-03-28 NOTE — Pre-Procedure Instructions (Signed)
Chart sent to [facility].

## 2015-03-29 NOTE — Progress Notes (Signed)
PROBLEMS: OSA COPD Obesity - underwent gastric bypass surgery 06/2014  INTERVAL HISTORY: No further weight loss. Total of approx 70# wt loss since surgery 05/16 Overnight oximetry on CPAP but off O2 - no nocturnal desaturation. Oxygen was discontinued per my order  SUBJ: No new complaints. No dyspnea or other respiratory symptoms. Has not lost any further weight which he attributes to holidays and vacation travel. He wishes to undergo repeat PSG to see if his OSA has resolved. He went without it for a week while on vacation and did not notice any excessive daytime sleepiness. Wife reports that he was not snoring  OBJ: Filed Vitals:   03/26/15 1138  BP: 130/82  Pulse: 67  Height: 5\' 7"  (1.702 m)  Weight: 212 lb 6.4 oz (96.344 kg)  SpO2: 93%    Pleasant NAD HEENT without acute findings No JVD Chest with full BS and no adventitious sounds Mildly obese, +BS No LE edema Neuro grossly intact  DATA:   IMPRESSION: Obesity - improving after gastric bypass surgery OSA - well controlled presently on CPAP. Did well of CPAP X 1 week while on vacation without snoring per his wife's report. OSA might be resolved COPD - stable. No evidence of reversible component. No worsening of symptoms off Advair and Spiriva  PLAN: Repeat PSG ordered. We will contact him when the results are available to me to discuss whether CPAP can be discontinued ROV 3 mos or PRN   Wilhelmina Mcardle, MD [facility]

## 2015-04-02 ENCOUNTER — Inpatient Hospital Stay
Admission: RE | Admit: 2015-04-02 | Discharge: 2015-04-03 | DRG: 254 | Disposition: A | Discharge status: Home / Self Care | Payer: Medicare Other | Source: Ambulatory Visit | Attending: Vascular Surgery | Admitting: Vascular Surgery

## 2015-04-02 ENCOUNTER — Encounter
Admission: RE | Disposition: A | Discharge status: Home / Self Care | Payer: Self-pay | Source: Ambulatory Visit | Attending: Vascular Surgery

## 2015-04-02 ENCOUNTER — Ambulatory Visit: Payer: Medicare Other | Admitting: Anesthesiology

## 2015-04-02 DIAGNOSIS — Z9049 Acquired absence of other specified parts of digestive tract: Secondary | ICD-10-CM

## 2015-04-02 DIAGNOSIS — M1990 Unspecified osteoarthritis, unspecified site: Secondary | ICD-10-CM | POA: Diagnosis present

## 2015-04-02 DIAGNOSIS — Z951 Presence of aortocoronary bypass graft: Secondary | ICD-10-CM | POA: Diagnosis not present

## 2015-04-02 DIAGNOSIS — I2510 Atherosclerotic heart disease of native coronary artery without angina pectoris: Secondary | ICD-10-CM | POA: Diagnosis present

## 2015-04-02 DIAGNOSIS — E559 Vitamin D deficiency, unspecified: Secondary | ICD-10-CM | POA: Diagnosis present

## 2015-04-02 DIAGNOSIS — I714 Abdominal aortic aneurysm, without rupture, unspecified: Secondary | ICD-10-CM | POA: Diagnosis present

## 2015-04-02 DIAGNOSIS — Z96651 Presence of right artificial knee joint: Secondary | ICD-10-CM | POA: Diagnosis present

## 2015-04-02 DIAGNOSIS — Z9841 Cataract extraction status, right eye: Secondary | ICD-10-CM | POA: Diagnosis not present

## 2015-04-02 DIAGNOSIS — Z96659 Presence of unspecified artificial knee joint: Secondary | ICD-10-CM | POA: Diagnosis present

## 2015-04-02 DIAGNOSIS — Z8589 Personal history of malignant neoplasm of other organs and systems: Secondary | ICD-10-CM

## 2015-04-02 DIAGNOSIS — K219 Gastro-esophageal reflux disease without esophagitis: Secondary | ICD-10-CM | POA: Diagnosis present

## 2015-04-02 DIAGNOSIS — J449 Chronic obstructive pulmonary disease, unspecified: Secondary | ICD-10-CM | POA: Diagnosis present

## 2015-04-02 DIAGNOSIS — I10 Essential (primary) hypertension: Secondary | ICD-10-CM | POA: Diagnosis present

## 2015-04-02 DIAGNOSIS — I739 Peripheral vascular disease, unspecified: Secondary | ICD-10-CM | POA: Diagnosis present

## 2015-04-02 DIAGNOSIS — Z8249 Family history of ischemic heart disease and other diseases of the circulatory system: Secondary | ICD-10-CM

## 2015-04-02 DIAGNOSIS — I708 Atherosclerosis of other arteries: Secondary | ICD-10-CM | POA: Diagnosis present

## 2015-04-02 DIAGNOSIS — Z9842 Cataract extraction status, left eye: Secondary | ICD-10-CM

## 2015-04-02 DIAGNOSIS — Z87891 Personal history of nicotine dependence: Secondary | ICD-10-CM

## 2015-04-02 DIAGNOSIS — Z961 Presence of intraocular lens: Secondary | ICD-10-CM | POA: Diagnosis present

## 2015-04-02 DIAGNOSIS — G4730 Sleep apnea, unspecified: Secondary | ICD-10-CM | POA: Diagnosis present

## 2015-04-02 HISTORY — PX: PERIPHERAL VASCULAR CATHETERIZATION: SHX172C

## 2015-04-02 LAB — PREPARE RBC (CROSSMATCH)

## 2015-04-02 LAB — GLUCOSE, CAPILLARY
GLUCOSE-CAPILLARY: 293 mg/dL — AB (ref 65–99)
Glucose-Capillary: 150 mg/dL — ABNORMAL HIGH (ref 65–99)
Glucose-Capillary: 101 mg/dL — ABNORMAL HIGH (ref 65–99)

## 2015-04-02 SURGERY — ENDOVASCULAR REPAIR/STENT GRAFT
Anesthesia: General

## 2015-04-02 MED ORDER — VITAMIN D 1000 UNITS PO TABS
2000.0000 [IU] | ORAL_TABLET | Freq: Every day | ORAL | Status: DC
  Administered 2015-04-02: 2000 [IU] via ORAL
  Filled 2015-04-02: qty 2

## 2015-04-02 MED ORDER — ADULT MULTIVITAMIN W/MINERALS CH
1.0000 | ORAL_TABLET | Freq: Every day | ORAL | Status: DC
Start: 2015-04-02 — End: 2015-04-03
  Administered 2015-04-02: 1 via ORAL
  Filled 2015-04-02: qty 1

## 2015-04-02 MED ORDER — VITAMIN B-12 1000 MCG PO TABS
1000.0000 ug | ORAL_TABLET | Freq: Every day | ORAL | Status: DC
  Filled 2015-04-02: qty 1

## 2015-04-02 MED ORDER — FAMOTIDINE IN NACL 20-0.9 MG/50ML-% IV SOLN
20.0000 mg | Freq: Two times a day (BID) | INTRAVENOUS | Status: DC
  Administered 2015-04-02: 20 mg via INTRAVENOUS
  Filled 2015-04-02 (×4): qty 50

## 2015-04-02 MED ORDER — NITROGLYCERIN 0.4 MG SL SUBL: 0.4000 mg | SUBLINGUAL_TABLET | SUBLINGUAL | Status: DC | PRN

## 2015-04-02 MED ORDER — ACETAMINOPHEN 650 MG RE SUPP: 325.0000 mg | RECTAL | Status: DC | PRN

## 2015-04-02 MED ORDER — SORBITOL 70 % SOLN
30.0000 mL | Freq: Every day | Status: DC | PRN
  Filled 2015-04-02: qty 30

## 2015-04-02 MED ORDER — LABETALOL HCL 5 MG/ML IV SOLN: 10.0000 mg | INTRAVENOUS | Status: DC | PRN

## 2015-04-02 MED ORDER — IOHEXOL 300 MG/ML  SOLN
INTRAMUSCULAR | Status: DC | PRN
  Administered 2015-04-02: 90 mL via INTRA_ARTERIAL

## 2015-04-02 MED ORDER — INSULIN ASPART 100 UNIT/ML ~~LOC~~ SOLN: 0.0000 [IU] | Freq: Three times a day (TID) | SUBCUTANEOUS | Status: DC

## 2015-04-02 MED ORDER — PHENYLEPHRINE HCL 10 MG/ML IJ SOLN
10000.0000 ug | INTRAMUSCULAR | Status: DC | PRN
  Administered 2015-04-02: 50 ug/min via INTRAVENOUS

## 2015-04-02 MED ORDER — FENTANYL CITRATE (PF) 100 MCG/2ML IJ SOLN: 25.0000 ug | INTRAMUSCULAR | Status: DC | PRN

## 2015-04-02 MED ORDER — DEXAMETHASONE SODIUM PHOSPHATE 10 MG/ML IJ SOLN
INTRAMUSCULAR | Status: DC | PRN
  Administered 2015-04-02: 5 mg via INTRAVENOUS

## 2015-04-02 MED ORDER — PANTOPRAZOLE SODIUM 40 MG PO TBEC: 40.0000 mg | DELAYED_RELEASE_TABLET | Freq: Every day | ORAL | Status: DC

## 2015-04-02 MED ORDER — FLEET ENEMA 7-19 GM/118ML RE ENEM: 1.0000 | ENEMA | Freq: Once | RECTAL | Status: DC | PRN

## 2015-04-02 MED ORDER — LACTATED RINGERS IV SOLN
INTRAVENOUS | Status: DC | PRN
  Administered 2015-04-02: 08:00:00 via INTRAVENOUS

## 2015-04-02 MED ORDER — NITROGLYCERIN IN D5W 200-5 MCG/ML-% IV SOLN: 5.0000 ug/min | INTRAVENOUS | Status: DC

## 2015-04-02 MED ORDER — CEFUROXIME SODIUM 1.5 G IJ SOLR
1.5000 g | Freq: Two times a day (BID) | INTRAMUSCULAR | Status: AC
  Administered 2015-04-02 (×2): 1.5 g via INTRAVENOUS
  Filled 2015-04-02 (×3): qty 1.5

## 2015-04-02 MED ORDER — ALUM & MAG HYDROXIDE-SIMETH 200-200-20 MG/5ML PO SUSP: 15.0000 mL | ORAL | Status: DC | PRN

## 2015-04-02 MED ORDER — ASPIRIN EC 81 MG PO TBEC
81.0000 mg | DELAYED_RELEASE_TABLET | Freq: Every day | ORAL | Status: DC
  Administered 2015-04-02: 81 mg via ORAL
  Filled 2015-04-02 (×3): qty 1

## 2015-04-02 MED ORDER — IPRATROPIUM-ALBUTEROL 20-100 MCG/ACT IN AERS: 1.0000 | INHALATION_SPRAY | Freq: Four times a day (QID) | RESPIRATORY_TRACT | Status: DC

## 2015-04-02 MED ORDER — ONDANSETRON HCL 4 MG/2ML IJ SOLN: 4.0000 mg | Freq: Four times a day (QID) | INTRAMUSCULAR | Status: DC | PRN

## 2015-04-02 MED ORDER — INSULIN ASPART 100 UNIT/ML ~~LOC~~ SOLN: 0.0000 [IU] | Freq: Every day | SUBCUTANEOUS | Status: DC

## 2015-04-02 MED ORDER — DOPAMINE-DEXTROSE 3.2-5 MG/ML-% IV SOLN: 3.0000 ug/kg/min | INTRAVENOUS | Status: DC

## 2015-04-02 MED ORDER — PROPOFOL 10 MG/ML IV BOLUS
INTRAVENOUS | Status: DC | PRN
  Administered 2015-04-02: 140 mg via INTRAVENOUS

## 2015-04-02 MED ORDER — DOCUSATE SODIUM 100 MG PO CAPS
100.0000 mg | ORAL_CAPSULE | Freq: Every day | ORAL | Status: DC
Start: 2015-04-03 — End: 2015-04-03

## 2015-04-02 MED ORDER — ALBUTEROL SULFATE (2.5 MG/3ML) 0.083% IN NEBU: 2.5000 mg | INHALATION_SOLUTION | RESPIRATORY_TRACT | Status: DC | PRN

## 2015-04-02 MED ORDER — IPRATROPIUM-ALBUTEROL 0.5-2.5 (3) MG/3ML IN SOLN
3.0000 mL | Freq: Four times a day (QID) | RESPIRATORY_TRACT | Status: DC
  Administered 2015-04-02 – 2015-04-03 (×2): 3 mL via RESPIRATORY_TRACT
  Filled 2015-04-02 (×3): qty 3

## 2015-04-02 MED ORDER — MIDAZOLAM HCL 2 MG/2ML IJ SOLN
INTRAMUSCULAR | Status: DC | PRN
  Administered 2015-04-02 (×2): 1 mg via INTRAVENOUS

## 2015-04-02 MED ORDER — SODIUM CHLORIDE 0.9 % IV SOLN
INTRAVENOUS | Status: DC
  Administered 2015-04-02: 13:00:00 via INTRAVENOUS

## 2015-04-02 MED ORDER — ONDANSETRON HCL 4 MG/2ML IJ SOLN
INTRAMUSCULAR | Status: DC | PRN
  Administered 2015-04-02: 4 mg via INTRAVENOUS

## 2015-04-02 MED ORDER — CEFAZOLIN SODIUM-DEXTROSE 2-3 GM-% IV SOLR
2.0000 g | INTRAVENOUS | Status: AC
  Administered 2015-04-02: 1 g via INTRAVENOUS
  Filled 2015-04-02: qty 50

## 2015-04-02 MED ORDER — LISINOPRIL 10 MG PO TABS: 40.0000 mg | ORAL_TABLET | Freq: Every day | ORAL | Status: DC

## 2015-04-02 MED ORDER — ONDANSETRON HCL 4 MG/2ML IJ SOLN: 4.0000 mg | Freq: Once | INTRAMUSCULAR | Status: DC | PRN

## 2015-04-02 MED ORDER — SUGAMMADEX SODIUM 200 MG/2ML IV SOLN
INTRAVENOUS | Status: DC | PRN
  Administered 2015-04-02: 188.6 mg via INTRAVENOUS

## 2015-04-02 MED ORDER — LORATADINE 10 MG PO TABS
10.0000 mg | ORAL_TABLET | Freq: Every day | ORAL | Status: DC
  Administered 2015-04-02: 10 mg via ORAL
  Filled 2015-04-02: qty 1

## 2015-04-02 MED ORDER — FENTANYL CITRATE (PF) 100 MCG/2ML IJ SOLN
INTRAMUSCULAR | Status: DC | PRN
  Administered 2015-04-02 (×2): 50 ug via INTRAVENOUS
  Administered 2015-04-02: 100 ug via INTRAVENOUS

## 2015-04-02 MED ORDER — METOPROLOL TARTRATE 25 MG PO TABS
25.0000 mg | ORAL_TABLET | Freq: Two times a day (BID) | ORAL | Status: DC
  Administered 2015-04-02: 25 mg via ORAL
  Filled 2015-04-02: qty 1

## 2015-04-02 MED ORDER — PHENOL 1.4 % MT LIQD: 1.0000 | OROMUCOSAL | Status: DC | PRN

## 2015-04-02 MED ORDER — SUCCINYLCHOLINE CHLORIDE 20 MG/ML IJ SOLN
INTRAMUSCULAR | Status: DC | PRN
  Administered 2015-04-02: 100 mg via INTRAVENOUS

## 2015-04-02 MED ORDER — HYDRALAZINE HCL 20 MG/ML IJ SOLN: 5.0000 mg | INTRAMUSCULAR | Status: DC | PRN

## 2015-04-02 MED ORDER — ROCURONIUM BROMIDE 100 MG/10ML IV SOLN
INTRAVENOUS | Status: DC | PRN
  Administered 2015-04-02: 50 mg via INTRAVENOUS
  Administered 2015-04-02: 10 mg via INTRAVENOUS
  Administered 2015-04-02: 20 mg via INTRAVENOUS

## 2015-04-02 MED ORDER — HEPARIN SODIUM (PORCINE) 1000 UNIT/ML IJ SOLN
INTRAMUSCULAR | Status: DC | PRN
  Administered 2015-04-02: 6000 [IU] via INTRAVENOUS

## 2015-04-02 MED ORDER — CLOPIDOGREL BISULFATE 75 MG PO TABS
75.0000 mg | ORAL_TABLET | Freq: Every day | ORAL | Status: DC
  Administered 2015-04-02: 75 mg via ORAL
  Filled 2015-04-02: qty 1

## 2015-04-02 MED ORDER — SODIUM CHLORIDE 0.9 % IV SOLN: 500.0000 mL | Freq: Once | INTRAVENOUS | Status: DC | PRN

## 2015-04-02 MED ORDER — POLYETHYLENE GLYCOL 3350 17 G PO PACK: 17.0000 g | PACK | Freq: Every day | ORAL | Status: DC | PRN

## 2015-04-02 MED ORDER — MAGNESIUM SULFATE 2 GM/50ML IV SOLN
2.0000 g | Freq: Every day | INTRAVENOUS | Status: DC | PRN
  Filled 2015-04-02: qty 50

## 2015-04-02 MED ORDER — METOPROLOL TARTRATE 1 MG/ML IV SOLN: 2.0000 mg | INTRAVENOUS | Status: DC | PRN

## 2015-04-02 MED ORDER — SODIUM CHLORIDE 0.9 % IV SOLN
INTRAVENOUS | Status: DC
  Administered 2015-04-02: 07:00:00 via INTRAVENOUS

## 2015-04-02 MED ORDER — LIDOCAINE HCL (CARDIAC) 20 MG/ML IV SOLN
INTRAVENOUS | Status: DC | PRN
  Administered 2015-04-02: 100 mg via INTRAVENOUS

## 2015-04-02 MED ORDER — GUAIFENESIN-DM 100-10 MG/5ML PO SYRP: 15.0000 mL | ORAL_SOLUTION | ORAL | Status: DC | PRN

## 2015-04-02 MED ORDER — SIMVASTATIN 40 MG PO TABS
40.0000 mg | ORAL_TABLET | Freq: Every day | ORAL | Status: DC
  Administered 2015-04-02: 40 mg via ORAL
  Filled 2015-04-02: qty 1

## 2015-04-02 MED ORDER — MORPHINE SULFATE (PF) 2 MG/ML IV SOLN
2.0000 mg | INTRAVENOUS | Status: DC | PRN
  Administered 2015-04-02: 4 mg via INTRAVENOUS
  Administered 2015-04-02: 2 mg via INTRAVENOUS
  Filled 2015-04-02: qty 1
  Filled 2015-04-02: qty 2

## 2015-04-02 MED ORDER — ACETAMINOPHEN 325 MG PO TABS
325.0000 mg | ORAL_TABLET | ORAL | Status: DC | PRN
  Administered 2015-04-02 – 2015-04-03 (×2): 650 mg via ORAL
  Filled 2015-04-02 (×2): qty 2

## 2015-04-02 MED ORDER — POTASSIUM CHLORIDE CRYS ER 20 MEQ PO TBCR: 20.0000 meq | EXTENDED_RELEASE_TABLET | Freq: Every day | ORAL | Status: DC | PRN

## 2015-04-02 MED ORDER — OLOPATADINE HCL 0.1 % OP SOLN
1.0000 [drp] | Freq: Two times a day (BID) | OPHTHALMIC | Status: DC
Start: 2015-04-02 — End: 2015-04-03
  Filled 2015-04-02: qty 5

## 2015-04-02 MED ORDER — OXYCODONE-ACETAMINOPHEN 5-325 MG PO TABS: 1.0000 | ORAL_TABLET | ORAL | Status: DC | PRN

## 2015-04-02 SURGICAL SUPPLY — 49 items
BALLN ULTRVRSE 8X40X75C (BALLOONS) ×3
BALLOON ULTRVRSE 8X40X75C (BALLOONS) ×1 IMPLANT
BLADE SURG 15 STRL LF DISP TIS (BLADE) ×1 IMPLANT
BLADE SURG 15 STRL SS (BLADE) ×2
BRUSH SCRUB 4% CHG (MISCELLANEOUS) ×3 IMPLANT
CATH ACCU-VU SIZ PIG 5F 70CM (CATHETERS) ×3 IMPLANT
CATH BALLN CODA 9X100X32 (BALLOONS) ×3 IMPLANT
CATH KA2 5FR 65CM (CATHETERS) ×3 IMPLANT
DEVICE CLOSURE PERCLS PRGLD 6F (VASCULAR PRODUCTS) ×4 IMPLANT
DEVICE PRESTO INFLATION (MISCELLANEOUS) ×3 IMPLANT
DEVICE SAFEGUARD 24CM (GAUZE/BANDAGES/DRESSINGS) ×6 IMPLANT
DEVICE TORQUE (MISCELLANEOUS) ×6 IMPLANT
DRYSEAL FLEXSHEATH 12FR 33CM (SHEATH) ×2
DRYSEAL FLEXSHEATH 18FR 33CM (SHEATH) ×2
ELECT CAUTERY BLADE 6.4 (BLADE) ×3 IMPLANT
ELECT REM PT RETURN 9FT ADLT (ELECTROSURGICAL) ×3
ELECTRODE REM PT RTRN 9FT ADLT (ELECTROSURGICAL) ×1 IMPLANT
EXCLUDER TNK LEG 28MX12X12 (Endovascular Graft) ×1 IMPLANT
EXCLUDER TRUNK LEG 28MX12X12 (Endovascular Graft) ×3 IMPLANT
GLIDEWIRE STIFF .35X180X3 HYDR (WIRE) ×3 IMPLANT
GLOVE BIO SURGEON STRL SZ7 (GLOVE) ×9 IMPLANT
GLOVE SURG SYN 8.0 (GLOVE) ×3 IMPLANT
GOWN STRL REUS W/ TWL LRG LVL3 (GOWN DISPOSABLE) ×1 IMPLANT
GOWN STRL REUS W/ TWL XL LVL3 (GOWN DISPOSABLE) ×2 IMPLANT
GOWN STRL REUS W/TWL LRG LVL3 (GOWN DISPOSABLE) ×2
GOWN STRL REUS W/TWL XL LVL3 (GOWN DISPOSABLE) ×4
IV NS 1000ML (IV SOLUTION) ×2
IV NS 1000ML BAXH (IV SOLUTION) ×1 IMPLANT
LEG CONTRALATERAL 16X18X13.5 (Endovascular Graft) ×3 IMPLANT
LEG CONTRALATERAL 18X11.5 (Endovascular Graft) ×3 IMPLANT
LIQUID BAND (GAUZE/BANDAGES/DRESSINGS) ×3 IMPLANT
PACK ANGIOGRAPHY (CUSTOM PROCEDURE TRAY) ×3 IMPLANT
PACK BASIN MAJOR ARMC (MISCELLANEOUS) ×3 IMPLANT
PERCLOSE PROGLIDE 6F (VASCULAR PRODUCTS) ×12
SET INTRO CAPELLA COAXIAL (SET/KITS/TRAYS/PACK) ×3 IMPLANT
SHEATH BRITE TIP 6FRX11 (SHEATH) ×6 IMPLANT
SHEATH BRITE TIP 8FRX11 (SHEATH) ×6 IMPLANT
SHEATH DRYSEAL FLEX 12FR 33CM (SHEATH) ×1 IMPLANT
SHEATH DRYSEAL FLEX 18FR 33CM (SHEATH) ×1 IMPLANT
SUT MNCRL 4-0 (SUTURE) ×2
SUT MNCRL 4-0 27XMFL (SUTURE) ×1
SUT PROLENE 6 0 BV (SUTURE) ×3 IMPLANT
SUTURE MNCRL 4-0 27XMF (SUTURE) ×1 IMPLANT
SYR 20CC LL (SYRINGE) ×3 IMPLANT
SYR MEDRAD MARK V 150ML (SYRINGE) ×3 IMPLANT
TOWEL OR 17X26 4PK STRL BLUE (TOWEL DISPOSABLE) ×6 IMPLANT
TUBING CONTRAST HIGH PRESS 72 (TUBING) ×3 IMPLANT
WIRE AMPLATZ SSTIFF .035X260CM (WIRE) ×6 IMPLANT
WIRE J 3MM .035X145CM (WIRE) ×6 IMPLANT

## 2015-04-02 NOTE — Transfer of Care (Signed)
Immediate Anesthesia Transfer of Care Note  Patient: Mario Ward  Procedure(s) Performed: Procedure(s): Endovascular Repair/Stent Graft (N/A)  Patient Location: PACU  Anesthesia Type:General  Level of Consciousness: awake, alert  and oriented  Airway & Oxygen Therapy: Patient Spontanous Breathing and Patient connected to face mask oxygen  Post-op Assessment: Report given to RN and Post -op Vital signs reviewed and stable  Post vital signs: Reviewed and stable  Last Vitals:  Filed Vitals:   04/02/15 0648  BP: 148/79  Pulse: 62  Temp: 36.5 C  Resp: 17    Complications: No apparent anesthesia complications

## 2015-04-02 NOTE — Op Note (Signed)
OPERATIVE NOTE   PROCEDURE: 1. US guidance for vascular access, bilateral femoral arteries 2. Catheter placement into aorta from bilateral femoral approaches 3. Placement of a 28 mm diameter by 12 cm length Gore Excluder Endoprosthesis main body right with a 18 mm diameter by 14 cm length left contralateral limb 4. Placement of an 18 mm diameter by 12 cm length right iliac extension limb leak coverage of aneurysm down to the hypogastric artery 5. Angioplasty of the right external iliac artery proximally for significant stenosis with 38m diameter by 4 cm length angioplasty balloon 6. 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  AAA, stenosis in right proximal external iliac artery of about 60-70%  SPECIMEN(S):  none  INDICATIONS:   Mario Worleyis a 70y.o. male who presents with a rapidly growing abdominal aortic aneurysm nearing 5 cm in maximal diameter. He has some associated occlusive disease of his iliac arteries as well. He is brought in for endovascular repair of abdominal aortic aneurysm to prevent rupture. Risks and benefits were discussed and informed consent was obtained.  DESCRIPTION: After obtaining full informed written consent, the patient was brought back to the operating room and placed supine upon the operating table.  The patient received IV antibiotics prior to induction.  After obtaining adequate anesthesia, the patient was prepped and draped in the standard fashion for endovascular AAA repair.  We then began by gaining access to both femoral arteries with UKoreaguidance with me working on the left and Dr. SDelana Meyerworking on the right.  The femoral arteries were found to be patent  and accessed without difficulty with a needle under ultrasound guidance without difficulty on each side and permanent images were recorded.  We then placed 2 proglide devices on each side in a pre-close fashion and placed 8 French sheaths. The patient was then given  6000 units of intravenous heparin. The Pigtail catheter was placed into the aorta from the  left side. Using this image, we selected a 28 mm diameter by 12 cm length Main body device.  Over a stiff wire, an 14French sheath was placed up the right side into the aorta. The main body was then placed through the 18 French sheath. A Kumpe catheter was placed up the left side and a magnified image at the renal arteries was performed. The main body was then deployed just below the lowest renal artery. The Kumpe catheter was used to cannulate the contralateral gate without difficulty and successful cannulation was confirmed by twirling the pigtail catheter in the main body. We then placed a stiff wire and a retrograde arteriogram was performed through the left femoral sheath. We upsized to the 12 FPakistansheath for the contralateral limb and a 18 mm diameter by 14 cm length limb was selected and deployed. The main body deployment was then completed. Based off the angiographic findings, extension limbs were necessary.  To extend the coverage to just above the right hypogastric artery and 18 mm diameter by 12 cm length limb was selected and deployed in the right system down to just above the hypogastric artery. All junction points and seals zones were treated with the compliant balloon. The pigtail catheter was then replaced and a completion angiogram was performed.  A small indeterminate Endoleak was detected on completion angiography as well as at least a moderate stenosis just below the end of the stent graft in the proximal external iliac artery which could create flow issues. The renal arteries were found to be widely patent. All junction points and seals  zones were treated again with a large compliant balloon to ensure that no type III endoleak was present. The right proximal external iliac artery stenosis was treated with an 8 mm diameter by 4 cm length angioplasty balloon inflated to 8 atm for 1 minute. Completion angiogram following angioplasty showed no significant residual stenosis in the external iliac artery. A possible small type II endoleak was seen on completion, but this may have also been bowel gas and was clearly not a type I or type III endoleak. At this point we elected to terminate the procedure. We secured the pro glide devices for hemostasis on the femoral arteries. The skin incision was closed with a 4-0 Monocryl. Dermabond and pressure dressing were placed. The patient was taken to the recovery room in stable condition having tolerated the procedure well.  COMPLICATIONS: none  CONDITION: stable  Mario Ward  04/02/2015, 9:51 AM

## 2015-04-02 NOTE — H&P (Signed)
[facility] Admission History & Physical  MRN : [id number]  Mario Ward is a 70 y.o. (07-Jan-1946) male who presents with chief complaint of rapidly increasing abdominal aortic aneurysm.  History of Present Illness: The patient presents for evaluation and treatment of his abdominal aortic aneurysm. He has been followed in the office and is been undergoing ultrasound surveillance. Recently he was noted to have increased the diameter of his aneurysm 5 mm which is considered rapid growth. He now measures 4.8 cm and considering the speed at which he is growing he requested that he undergo repair. CT scan was obtained which did show he is a good endovascular candidate.  At the present time he denies abdominal or back pain. There been no episodes of painful changes to his toes or blue discoloration consistent with embolic events. He denies claudication.  Patient denies shortness of breath or chest pain.  Current Facility-Administered Medications  Medication Dose Route Frequency Provider Last Rate Last Dose  . 0.9 %  sodium chloride infusion   Intravenous Continuous Andria Frames, MD 50 mL/hr at 04/02/15 0723    . ceFAZolin (ANCEF) IVPB 2 g/50 mL premix  2 g Intravenous On Call to [place], PA-C       Facility-Administered Medications Ordered in Other Encounters  Medication Dose Route Frequency Provider Last Rate Last Dose  . lactated ringers infusion    Continuous PRN Demetrius Charity, CRNA        Past Medical History  Diagnosis Date  . Hypertension   . GERD (gastroesophageal reflux disease)   . COPD (chronic obstructive pulmonary disease) ([place])   . Vitamin D deficiency   . Coronary artery disease 1997    CABG x 4   . Sleep apnea     uses a CPAP at night.   . AAA (abdominal aortic aneurysm) ([place])   . Diabetes mellitus without complication [facility])     Patient states he had Gastric Bypass and is no longer on meds.  . Anginal pain ([place])   .  Arthritis   . 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 Replacement total knee      right  . Coronary artery bypass graft  1997    CABG x 4 in [place]   . Cardiac catheterization  05/03/2013    [facility] in [place]  . Bariatric surgery    . Joint replacement Right     total knee replacement  . Cholecystectomy    . Eye surgery Bilateral     Cataract Extraction with IOL    Social History Social History  Substance Use Topics  . Smoking status: Former Smoker -- 1.00 packs/day for 40 years    Types: Cigarettes    Quit date: 09/07/2006  . Smokeless tobacco: Never Used  . Alcohol Use: No    Family History Family History  Problem Relation Age of Onset  . Heart disease Mother   . Heart attack Mother 83  . Heart disease Father   . Heart attack Father 50  . Heart disease Brother   . Heart attack Brother 62   no family history of bleeding or clotting disorders, porphyria or autoimmune disease.  No Known Allergies   REVIEW OF SYSTEMS (Negative unless checked)  Constitutional: [] Weight loss  [] Fever  [] Chills Cardiac: [] Chest pain   [] Chest pressure   [] Palpitations   [] Shortness of breath when laying flat   []   Shortness of breath at rest   [] Shortness of breath with exertion. Vascular:  [] Pain in legs with walking   [] Pain in legs at rest   [] Pain in legs when laying flat   [] Claudication   [] Pain in feet when walking  [] Pain in feet at rest  [] Pain in feet when laying flat   [] History of DVT   [] Phlebitis   [] Swelling in legs   [] Varicose veins   [] Non-healing ulcers Pulmonary:   [] Uses home oxygen   [] Productive cough   [] Hemoptysis   [] Wheeze  [x] COPD   [] Asthma Neurologic:  [] Dizziness  [] Blackouts   [] Seizures   [] History of stroke   [] History of TIA  [] Aphasia   [] Temporary blindness   [] Dysphagia   [] Weakness or numbness in arms   [] Weakness or numbness in legs Musculoskeletal:  [] Arthritis   [] Joint swelling   [] Joint pain   [] Low  back pain Hematologic:  [] Easy bruising  [] Easy bleeding   [] Hypercoagulable state   [] Anemic  [] Hepatitis Gastrointestinal:  [] Blood in stool   [] Vomiting blood  [] Gastroesophageal reflux/heartburn   [] Difficulty swallowing. Genitourinary:  [] Chronic kidney disease   [] Difficult urination  [] Frequent urination  [] Burning with urination   [] Blood in urine Skin:  [] Rashes   [] Ulcers   [] Wounds Psychological:  [] History of anxiety   []  History of major depression.  Physical Examination  Filed Vitals:   04/02/15 0648  BP: 148/79  Pulse: 62  Temp: 97.7 F (36.5 C)  TempSrc: Oral  Resp: 17  Height: 5\' 7"  (1.702 m)  Weight: 94.348 kg (208 lb)  SpO2: 96%   Body mass index is 32.57 kg/(m^2). Gen: WD/WN, NAD Head: [place]/AT, No temporalis wasting.  Ear/Nose/Throat: Hearing grossly intact, nares w/o erythema or drainage, oropharynx w/o Erythema/Exudate, Eyes: PERRLA, EOMI.  Neck: Supple, no nuchal rigidity.  No bruit or JVD.  Pulmonary:  Good air movement, clear to auscultation bilaterally, no increased work of respiration or use of accessory muscles  Cardiac: RRR, normal S1, S2, no Murmurs, rubs or gallops. Vascular:  Vessel Right Left  Radial Palpable Palpable  Ulnar Palpable Palpable  Brachial Palpable Palpable  Carotid Palpable, without bruit Palpable, without bruit  Aorta Not palpable N/A  Femoral Palpable Palpable  Popliteal Palpable Palpable  PT Palpable Palpable  DP Palpable Palpable   Gastrointestinal: soft, non-tender/non-distended. No guarding/reflex. No masses, surgical incisions, or scars. Musculoskeletal: M/S 5/5 throughout.  No deformity or atrophy. Neurologic: CN 2-12 intact. Pain and light touch intact in extremities.  Symmetrical.  Speech is fluent. Motor exam as listed above. Psychiatric: Judgment intact, Mood & affect appropriate for pt's clinical situation. Dermatologic: No rashes or ulcers noted.  No cellulitis or open wounds. Lymph : No Cervical, Axillary, or  Inguinal lymphadenopathy.    CBC Lab Results  Component Value Date   WBC 5.0 03/27/2015   HGB 13.6 03/27/2015   HCT 39.7* 03/27/2015   MCV 87.2 03/27/2015   PLT 219 03/27/2015    BMET    Component Value Date/Time   NA 137 03/27/2015 1227   K 3.9 03/27/2015 1227   CL 106 03/27/2015 1227   CO2 23 03/27/2015 1227   GLUCOSE 137* 03/27/2015 1227   BUN 22* 03/27/2015 1227   CREATININE 0.68 03/27/2015 1227   CALCIUM 9.4 03/27/2015 1227   GFRNONAA >60 03/27/2015 1227   GFRAA >60 03/27/2015 1227   Estimated Creatinine Clearance: 92.7 mL/min (by C-G formula based on Cr of 0.68).  COAG Lab Results  Component Value Date  INR 1.10 03/27/2015    Radiology Chest 2 View  03/27/2015  CLINICAL DATA:  Aortic repair. EXAM: CHEST  2 VIEW COMPARISON:  No recent prior. FINDINGS: Mediastinum hilar structures are normal. Prior CABG. Heart size normal. No focal infiltrate. Pleural parenchymal thickening noted consistent with scarring. IMPRESSION: 1. Bilateral pleural-parenchymal thickening noted consistent with scarring. 2.  Prior CABG.  Heart size normal . Electronically Signed   By: Marcello Moores  Register   On: 03/27/2015 15:55    Assessment/Plan 1.  Abdominal aortic aneurysm:  Patient has demonstrated rapid growth with 5 mm over the past 6 months. He is now 2 mm below the threshold for asymptomatic repair but given his rapid growth he is greatly concerned about lethal rupture and therefore is requested that he undergo repair at this time. CT scan shows he is an excellent candidate for endovascular repair. The risks and benefits are reviewed all questions are answered patient wishes to proceed with endovascular repair of his abdominal aortic aneurysm.  2.  COPD:  Patient will continue his aerosol treatment 100 toilet will be stressed in the postoperative period 3.  Hypertension:  Patient will continue medical management; nephrology is following no changes in oral medications. 4. Diabetes mellitus:   Glucose will be monitored and oral medications been held this morning once the patient has undergone the patient's procedure po intake will be reinitiated and again Accu-Cheks will be used to assess the blood glucose level and treat as needed. The patient will be restarted on the patient's usual hypoglycemic regime 5.  Coronary artery disease:  EKG will be monitored. Nitrates will be used if needed. The patient's oral cardiac medications will be continued.     Dilana Mcphie, Dolores Lory, MD  04/02/2015 7:51 AM

## 2015-04-02 NOTE — Anesthesia Preprocedure Evaluation (Signed)
Anesthesia Evaluation  Patient identified by MRN, date of birth, ID band Patient awake    Reviewed: Allergy & Precautions, NPO status , Patient's Chart, lab work & pertinent test results  Airway Mallampati: II  TM Distance: >3 FB     Dental   Pulmonary shortness of breath and with exertion, sleep apnea and Continuous Positive Airway Pressure Ventilation , COPD,  COPD inhaler, former smoker,    Pulmonary exam normal breath sounds clear to auscultation       Cardiovascular hypertension, Pt. on medications and Pt. on home beta blockers + angina with exertion + CAD and + Peripheral Vascular Disease  Normal cardiovascular exam     Neuro/Psych negative neurological ROS  negative psych ROS   GI/Hepatic Neg liver ROS, GERD  Medicated and Controlled,  Endo/Other  diabetes, Well ControlledOn no meds for diabetes  Renal/GU negative Renal ROS  negative genitourinary   Musculoskeletal  (+) Arthritis , Osteoarthritis,    Abdominal Normal abdominal exam  (+)   Peds negative pediatric ROS (+)  Hematology negative hematology ROS (+)   Anesthesia Other Findings   Reproductive/Obstetrics                             Anesthesia Physical Anesthesia Plan  ASA: III  Anesthesia Plan: General   Post-op Pain Management:    Induction: Intravenous  Airway Management Planned: Oral ETT  Additional Equipment:   Intra-op Plan:   Post-operative Plan: Extubation in OR  Informed Consent: I have reviewed the patients History and Physical, chart, labs and discussed the procedure including the risks, benefits and alternatives for the proposed anesthesia with the patient or authorized representative who has indicated his/her understanding and acceptance.   Dental advisory given  Plan Discussed with: CRNA and Surgeon  Anesthesia Plan Comments:         Anesthesia Quick Evaluation

## 2015-04-02 NOTE — Op Note (Signed)
OPERATIVE NOTE   PROCEDURE: 1. US guidance for vascular access, bilateral femoral arteries 2. Catheter placement into aorta from bilateral femoral approaches 3. Placement of a C3 Gore Excluder Endoprosthesis main body 20x12x12 with a 18 x 14 contralateral limb 4. Placement of a C3 Gore Excluder iliac extender right side 18 x 12 5. Percutaneous transluminal angioplasty of the right external iliac artery to 8 mm 6. ProGlide closure devices bilateral femoral arteries  PRE-OPERATIVE DIAGNOSIS: AAA; atherosclerotic occlusive disease with claudication right greater than left  POST-OPERATIVE DIAGNOSIS: same  SURGEON: Hortencia Pilar, MD and Leotis Pain, MD - Co-surgeons  ANESTHESIA: general  ESTIMATED BLOOD LOSS: 50 cc  FINDING(S): 1.  AAA  SPECIMEN(S):  none  INDICATIONS:   Mario Ward is a 70 y.o. y.o. male who presents with an abdominal aortic aneurysm which has grown rapidly over the last 6 months. Currently it is now 4.8 cm it is increased in diameter 5 mm over the past 6 months. Therefore, the patient wishes to have it repaired to prevent lethal rupture. On discussion with his wife he also has been complaining of buttock and thigh claudication. The risks and benefits for endovascular repair and possible intervention have been reviewed all questions of been answered patient has agreed to proceed.  DESCRIPTION: After obtaining full informed written consent, the patient was brought back to the operating room and placed supine upon the operating table.  The patient received IV antibiotics prior to induction.  After obtaining adequate anesthesia, the patient was prepped and draped in the standard fashion for endovascular AAA repair.  We then began by gaining access to both femoral arteries with US guidance with me working on the right and Dr. Lucky Cowboy working on the left.  The femoral arteries were found to be patent and accessed without difficulty with a needle under ultrasound guidance  without difficulty on each side and permanent images were recorded.  We then placed 2 proglide devices on each side in a pre-close fashion and placed 8 French sheaths.   The patient was then given  6000 units of intravenous heparin and this was allowed to circulate for proximally 5 minutes.  The Pigtail catheter was placed into the aorta from the  left side. Image of the abdominal aorta was then obtained focusing on the T12 level to identify the origin of the renal arteries. Using this image, we selected a C3 Gore Excluder 28 x 12 x 12 Main body device.  Over a stiff wire, an 33 French sheath was placed on the right. The main body was then placed through the 18 French sheath. A Kumpe catheter was placed up the left side and a magnified image at the renal arteries was performed. The main body was then deployed just below the lowest renal artery.   The Kumpe catheter was used to cannulate the contralateral gate without difficulty and successful cannulation was confirmed by twirling the marker pigtail catheter in the main body. We then placed a stiff wire through the pigtail catheter and a retrograde arteriogram was performed through the 8 French left femoral sheath. We upsized to a 12 [place] sheath for the contralateral limb and a 18 x 14 limb was selected and deployed. The main body deployment was then completed. Based off the angiographic findings, an extension limb was necessary.  A 18 x 12 iliac extender limb was then opened onto the field and prepped advanced through the right femoral sheath and deployed down to the distal common iliac. All junction  points and seals zones were treated with the compliant balloon.   The pigtail catheter was then replaced and a completion angiogram was performed.   Small endoleak was detected on completion angiography. 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 The renal  arteries were found to be widely patent. However, also identified on the completion films was a greater than 80% diameter reduction of the external iliac artery on the right. This is not involved with the aneurysmal disease where the endograft has been deployed from the infrarenal aorta to the common iliac arteries. This represents a separate and distinct lesion and based on measurements of the external iliac artery and 8 x 4 ultra versed balloon was selected advanced across the lesion and inflated to 8-10 atm 1 full minute. Follow-up imaging demonstrated complete resolution with less than 5% residual stenosis. There is now dramatically improved flow noted on the right side.  At this point we elected to terminate the procedure. We secured the pro glide devices for hemostasis on the femoral arteries. The skin incision was closed with a 4-0 Monocryl. Dermabond and pressure dressing were placed. The patient was taken to the recovery room in stable condition having tolerated the procedure well.  COMPLICATIONS: none  CONDITION: stable  Mario Ward  06/08/2014, 3:51 PM

## 2015-04-02 NOTE — Anesthesia Procedure Notes (Signed)
Procedure Name: Intubation Performed by: Demetrius Charity Pre-anesthesia Checklist: Patient identified, Patient being monitored, Timeout performed, Emergency Drugs available and Suction available Patient Re-evaluated:Patient Re-evaluated prior to inductionOxygen Delivery Method: Circle system utilized Preoxygenation: Pre-oxygenation with 100% oxygen Intubation Type: IV induction Ventilation: Mask ventilation without difficulty and Oral airway inserted - appropriate to patient size Laryngoscope Size: 3 and McGraph Grade View: Grade II Tube type: Oral Tube size: 7.0 mm Number of attempts: 1 Airway Equipment and Method: Stylet Placement Confirmation: ETT inserted through vocal cords under direct vision,  positive ETCO2 and breath sounds checked- equal and bilateral Secured at: 22 cm Tube secured with: Tape Dental Injury: Teeth and Oropharynx as per pre-operative assessment

## 2015-04-02 NOTE — Progress Notes (Signed)
Pt's CBG 293, pt refused insulin at current however agreed to re-check CBG at 1000 and possibly take SSI, night RN updated, MD/Dew made aware, nursing will cont to monitor

## 2015-04-03 ENCOUNTER — Ambulatory Visit: Payer: Medicare Other | Admitting: Cardiovascular Disease

## 2015-04-03 LAB — CBC
HCT: 36.7 % — ABNORMAL LOW (ref 40.0–52.0)
HEMOGLOBIN: 12.6 g/dL — AB (ref 13.0–18.0)
MCH: 30.7 pg (ref 26.0–34.0)
MCHC: 34.4 g/dL (ref 32.0–36.0)
MCV: 89.1 fL (ref 80.0–100.0)
Platelets: 197 10*3/uL (ref 150–440)
RBC: 4.12 MIL/uL — ABNORMAL LOW (ref 4.40–5.90)
RDW: 13.5 % (ref 11.5–14.5)
WBC: 9.2 10*3/uL (ref 3.8–10.6)

## 2015-04-03 LAB — BASIC METABOLIC PANEL
Anion gap: 7 (ref 5–15)
BUN: 12 mg/dL (ref 6–20)
CHLORIDE: 107 mmol/L (ref 101–111)
CO2: 26 mmol/L (ref 22–32)
CREATININE: 0.81 mg/dL (ref 0.61–1.24)
Calcium: 9 mg/dL (ref 8.9–10.3)
GFR calc Af Amer: >60 mL/min (ref >60)
GFR calc non Af Amer: >60 mL/min (ref >60)
Glucose, Bld: 117 mg/dL — ABNORMAL HIGH (ref 65–99)
Potassium: 3.6 mmol/L (ref 3.5–5.1)
SODIUM: 140 mmol/L (ref 135–145)

## 2015-04-03 LAB — GLUCOSE, CAPILLARY: GLUCOSE-CAPILLARY: 115 mg/dL — AB (ref 65–99)

## 2015-04-03 MED ORDER — MORPHINE SULFATE (PF) 2 MG/ML IV SOLN
INTRAVENOUS | Status: AC
  Filled 2015-04-03: qty 1

## 2015-04-03 NOTE — Progress Notes (Signed)
Pt d/c hm per MD order, pt VSS, pt family at [facility], d/c instructions given, pt verbalized understanding of d/c, all questions answered

## 2015-04-03 NOTE — Anesthesia Postprocedure Evaluation (Signed)
Anesthesia Post Note  Patient: Mario Ward  Procedure(s) Performed: Procedure(s) (LRB): Endovascular Repair/Stent Graft (N/A)  Patient location during evaluation: Other Anesthesia Type: Spinal Level of consciousness: oriented and awake and alert Pain management: pain level controlled Vital Signs Assessment: post-procedure vital signs reviewed and stable Respiratory status: spontaneous breathing, respiratory function stable and patient connected to nasal cannula oxygen Cardiovascular status: blood pressure returned to baseline and stable Postop Assessment: no headache and no backache Anesthetic complications: no    Last Vitals:  Filed Vitals:   04/03/15 0500 04/03/15 0600  BP: 162/89 141/78  Pulse: 74 69  Temp:    Resp: 18 22    Last Pain:  Filed Vitals:   04/03/15 0620  PainSc: 0-No pain                 Alison Stalling

## 2015-04-03 NOTE — Discharge Summary (Signed)
[facility]    Discharge Summary    Patient ID:  Mario Ward MRN: [id number] DOB/AGE: May 16, 1945 70 y.o.  Admit date: 04/02/2015 Discharge date: 04/03/2015 Date of Surgery: 04/02/2015 Surgeon: Surgeon(s): Katha Cabal, MD  Admission Diagnosis: AAA   GORE REP NEEDED  DR Lucky Cowboy TO ASSIST  Discharge Diagnoses:  AAA   GORE REP NEEDED  DR Lucky Cowboy TO ASSIST  Secondary Diagnoses: Past Medical History  Diagnosis Date  . Hypertension   . GERD (gastroesophageal reflux disease)   . COPD (chronic obstructive pulmonary disease) ([place])   . Vitamin D deficiency   . Coronary artery disease 1997    CABG x 4   . Sleep apnea     uses a CPAP at night.   . AAA (abdominal aortic aneurysm) ([place])   . Diabetes mellitus without complication [facility])     Patient states he had Gastric Bypass and is no longer on meds.  . Anginal pain ([place])   . Arthritis   . 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 male is S/P endovascular AAA repair Procedure(s): Endovascular Repair/Stent Graft Extubated: POD #0 Physical exam: sitting up eating regular diet already has urinated after foley out  Groins CD&I Post-op wounds CD&I Pt. Ambulating, voiding and taking PO diet without difficulty. Pt pain controlled with PO pain meds. Labs as below Complications:none Consults:     Significant Diagnostic Studies: CBC Lab Results  Component Value Date   WBC 9.2 04/03/2015   HGB 12.6* 04/03/2015   HCT 36.7* 04/03/2015   MCV 89.1 04/03/2015   PLT 197 04/03/2015    BMET    Component Value Date/Time   NA 140 04/03/2015 0406   K 3.6 04/03/2015 0406   CL 107 04/03/2015 0406   CO2 26 04/03/2015 0406   GLUCOSE 117* 04/03/2015 0406   BUN 12 04/03/2015 0406   CREATININE 0.81  04/03/2015 0406   CALCIUM 9.0 04/03/2015 0406   GFRNONAA >60 04/03/2015 0406   GFRAA >60 04/03/2015 0406   COAG Lab Results  Component Value Date   INR 1.10 03/27/2015     Disposition:  Discharge to :home Discharge Instructions    Call MD for:  redness, tenderness, or signs of infection (pain, swelling, bleeding, redness, odor or green/yellow discharge around incision site)    Complete by:  As directed      Call MD for:  severe or increased pain, loss or decreased feeling  in affected limb(s)    Complete by:  As directed      Call MD for:  temperature >100.5    Complete by:  As directed      Discharge instructions    Complete by:  As directed   OK to shower no tub baths for 1 week     Driving Restrictions    Complete by:  As directed   No driving for 1 week     Lifting restrictions    Complete by:  As directed   No lifting for 1 week     Resume previous diet    Complete by:  As directed             Medication List    TAKE these medications  albuterol (2.5 MG/3ML) 0.083% nebulizer solution  Commonly known as:  PROVENTIL  Take 2.5 mg by nebulization every 2 (two) hours as needed for wheezing or shortness of breath.     aspirin EC 81 MG tablet  Take 81 mg by mouth daily.     cholecalciferol 1000 units tablet  Commonly known as:  VITAMIN D  Take 2,000 Units by mouth daily.     clopidogrel 75 MG tablet  Commonly known as:  PLAVIX  Take 75 mg by mouth daily.     COMBIVENT RESPIMAT 20-100 MCG/ACT Aers respimat  Generic drug:  Ipratropium-Albuterol  Inhale 1 puff into the lungs every 6 (six) hours.     lisinopril 40 MG tablet  Commonly known as:  PRINIVIL,ZESTRIL  Take 40 mg by mouth daily.     loratadine 10 MG tablet  Commonly known as:  CLARITIN  Take 10 mg by mouth daily.     metoprolol tartrate 25 MG tablet  Commonly known as:  LOPRESSOR  Take 25 mg by mouth 2 (two) times daily.     multivitamin tablet  Take 1 tablet by mouth daily.      nitroGLYCERIN 0.4 MG SL tablet  Commonly known as:  NITROSTAT  Place 0.4 mg under the tongue every 5 (five) minutes as needed for chest pain.     PATADAY 0.2 % Soln  Generic drug:  Olopatadine HCl  Apply 2 drops to eye as needed.     RABEprazole 20 MG tablet  Commonly known as:  ACIPHEX  Take 20 mg by mouth daily.     simvastatin 40 MG tablet  Commonly known as:  ZOCOR  Take 1 tablet (40 mg total) by mouth daily.     vitamin B-12 1000 MCG tablet  Commonly known as:  CYANOCOBALAMIN  Take 1,000 mcg by mouth daily.       Verbal and written Discharge instructions given to the patient. Wound care per Discharge AVS     Follow-up Information    Follow up with Shakeria Robinette, Dolores Lory, MD In 1 week.   Specialties:  Vascular Surgery, Cardiology, Radiology, Vascular Surgery   Why:  follow up after procedure, OK with MD or PA  no studies   Contact information:   [place] [id number]       Signed: Katha Cabal, MD  04/03/2015, 8:55 AM

## 2015-04-09 ENCOUNTER — Encounter: Payer: Self-pay | Admitting: Vascular Surgery

## 2015-04-23 ENCOUNTER — Ambulatory Visit: Payer: Medicare Other | Attending: Pulmonary Disease

## 2015-04-23 DIAGNOSIS — G4733 Obstructive sleep apnea (adult) (pediatric): Secondary | ICD-10-CM | POA: Diagnosis present

## 2015-04-23 DIAGNOSIS — G4761 Periodic limb movement disorder: Secondary | ICD-10-CM | POA: Diagnosis not present

## 2015-05-02 DIAGNOSIS — G4730 Sleep apnea, unspecified: Secondary | ICD-10-CM | POA: Diagnosis not present

## 2015-08-08 ENCOUNTER — Emergency Department
Admission: EM | Admit: 2015-08-08 | Discharge: 2015-08-08 | Disposition: A | Discharge status: Home / Self Care | Payer: Medicare Other | Attending: Emergency Medicine | Admitting: Emergency Medicine

## 2015-08-08 DIAGNOSIS — Z87891 Personal history of nicotine dependence: Secondary | ICD-10-CM | POA: Diagnosis not present

## 2015-08-08 DIAGNOSIS — Y9389 Activity, other specified: Secondary | ICD-10-CM | POA: Insufficient documentation

## 2015-08-08 DIAGNOSIS — S6991XA Unspecified injury of right wrist, hand and finger(s), initial encounter: Secondary | ICD-10-CM | POA: Diagnosis present

## 2015-08-08 DIAGNOSIS — I2510 Atherosclerotic heart disease of native coronary artery without angina pectoris: Secondary | ICD-10-CM | POA: Diagnosis not present

## 2015-08-08 DIAGNOSIS — S61204A Unspecified open wound of right ring finger without damage to nail, initial encounter: Secondary | ICD-10-CM | POA: Insufficient documentation

## 2015-08-08 DIAGNOSIS — Z85068 Personal history of other malignant neoplasm of small intestine: Secondary | ICD-10-CM | POA: Insufficient documentation

## 2015-08-08 DIAGNOSIS — Z955 Presence of coronary angioplasty implant and graft: Secondary | ICD-10-CM | POA: Diagnosis not present

## 2015-08-08 DIAGNOSIS — I10 Essential (primary) hypertension: Secondary | ICD-10-CM | POA: Insufficient documentation

## 2015-08-08 DIAGNOSIS — E785 Hyperlipidemia, unspecified: Secondary | ICD-10-CM | POA: Diagnosis not present

## 2015-08-08 DIAGNOSIS — Y929 Unspecified place or not applicable: Secondary | ICD-10-CM | POA: Insufficient documentation

## 2015-08-08 DIAGNOSIS — W268XXA Contact with other sharp object(s), not elsewhere classified, initial encounter: Secondary | ICD-10-CM | POA: Insufficient documentation

## 2015-08-08 DIAGNOSIS — S61209A Unspecified open wound of unspecified finger without damage to nail, initial encounter: Secondary | ICD-10-CM

## 2015-08-08 DIAGNOSIS — E119 Type 2 diabetes mellitus without complications: Secondary | ICD-10-CM | POA: Diagnosis not present

## 2015-08-08 DIAGNOSIS — Z79899 Other long term (current) drug therapy: Secondary | ICD-10-CM | POA: Insufficient documentation

## 2015-08-08 DIAGNOSIS — Z7982 Long term (current) use of aspirin: Secondary | ICD-10-CM | POA: Insufficient documentation

## 2015-08-08 DIAGNOSIS — M1990 Unspecified osteoarthritis, unspecified site: Secondary | ICD-10-CM | POA: Diagnosis not present

## 2015-08-08 DIAGNOSIS — Z8679 Personal history of other diseases of the circulatory system: Secondary | ICD-10-CM | POA: Diagnosis not present

## 2015-08-08 DIAGNOSIS — Y999 Unspecified external cause status: Secondary | ICD-10-CM | POA: Diagnosis not present

## 2015-08-08 DIAGNOSIS — J449 Chronic obstructive pulmonary disease, unspecified: Secondary | ICD-10-CM | POA: Insufficient documentation

## 2015-08-08 DIAGNOSIS — Z8719 Personal history of other diseases of the digestive system: Secondary | ICD-10-CM | POA: Insufficient documentation

## 2015-08-08 MED ORDER — LIDOCAINE-EPINEPHRINE (PF) 1 %-1:200000 IJ SOLN
INTRAMUSCULAR | Status: AC
  Filled 2015-08-08: qty 30

## 2015-08-08 MED ORDER — TRAMADOL HCL 50 MG PO TABS: 50.0000 mg | ORAL_TABLET | Freq: Four times a day (QID) | ORAL | Status: DC | PRN

## 2015-08-08 NOTE — ED Provider Notes (Signed)
[facility] Emergency Department Provider Note  ____________________________________________  Time seen: Approximately 3:50 PM  I have reviewed the triage vital signs and the nursing notes.   HISTORY  Chief Complaint Laceration   HPI Mario Ward is a 70 y.o. male who presents to the emergency department for evaluation after slicing the tip of his right finger off with a mandolin slicer. He is taking Plavix and has been unable to stop the bleeding with pressure. Incident occurred about 30 minutes prior to arrival.  Past Medical History  Diagnosis Date  . Hypertension   . GERD (gastroesophageal reflux disease)   . COPD (chronic obstructive pulmonary disease) ([place])   . Vitamin D deficiency   . Coronary artery disease 1997    CABG x 4   . Sleep apnea     uses a CPAP at night.   . AAA (abdominal aortic aneurysm) ([place])   . Diabetes mellitus without complication [facility])     Patient states he had Gastric Bypass and is no longer on meds.  . Anginal pain ([place])   . Arthritis   . Cancer [facility])     duodenum    Patient Active Problem List   Diagnosis Date Noted  . AAA (abdominal aortic aneurysm) ([place]) 04/02/2015  . OSA on CPAP 07/29/2014  . 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 Obstructive sleep apnea 10/04/2013  . Chronic hypoxemic respiratory failure ([place]) 09/29/2013  . Atherosclerosis of coronary artery bypass graft with other forms of angina pectoris ([place]) 09/06/2013  . SOB (shortness of breath) on exertion 09/06/2013  . 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 Diabetes mellitus type 2 with complications ([place]) 99991111  . Hyperlipidemia 09/06/2013  . Essential hypertension 09/06/2013  . Morbid obesity ([place]) 09/06/2013  . Preop cardiovascular exam 09/06/2013  . Gastroesophageal reflux disease without esophagitis 09/06/2013  . COPD (chronic obstructive pulmonary disease) ([place]) 09/06/2013    Past Surgical History  Procedure Laterality Date  . Replacement total knee      right  . Coronary artery bypass graft  1997    CABG x 4 in [place]   . Cardiac catheterization  05/03/2013    [facility] [place]  . Bariatric surgery    . Joint replacement Right     total knee replacement  . Cholecystectomy    . Eye surgery Bilateral     Cataract Extraction with IOL  . Peripheral vascular catheterization N/A 04/02/2015    Procedure: Endovascular Repair/Stent Graft;  Surgeon: Katha Cabal, MD;  Location: [place] [facility];  Service: Cardiovascular;  Laterality: N/A;    Current Outpatient Rx  Name  Route  Sig  Dispense  Refill  . albuterol (PROVENTIL) (2.5 MG/3ML) 0.083% nebulizer solution   Nebulization   Take 2.5 mg by nebulization every 2 (two) hours as needed for wheezing or shortness of breath.         Marland Kitchen aspirin EC 81 MG tablet   Oral   Take 81 mg by mouth daily.         . cholecalciferol (VITAMIN D) 1000 UNITS tablet   Oral   Take 2,000 Units by mouth daily.         . clopidogrel (PLAVIX) 75 MG tablet   Oral   Take 75 mg by mouth daily.         . Ipratropium-Albuterol (COMBIVENT RESPIMAT) 20-100 MCG/ACT AERS respimat   Inhalation   Inhale 1 puff into the lungs every 6 (six) hours.         Marland Kitchen  lisinopril (PRINIVIL,ZESTRIL) 40 MG tablet   Oral   Take 40 mg by mouth daily.         Marland Kitchen loratadine (CLARITIN) 10 MG tablet   Oral   Take 10 mg by mouth daily.         . metoprolol tartrate (LOPRESSOR) 25 MG tablet   Oral   Take 25 mg by mouth 2 (two) times daily.          . Multiple Vitamin (MULTIVITAMIN) tablet   Oral   Take 1 tablet by mouth daily.         . nitroGLYCERIN (NITROSTAT) 0.4 MG SL tablet   Sublingual   Place 0.4 mg under the tongue every 5 (five) minutes as needed for chest pain.         Marland Kitchen Olopatadine HCl (PATADAY) 0.2 % SOLN   Ophthalmic   Apply 2 drops to eye as needed.          . RABEprazole (ACIPHEX) 20 MG tablet   Oral   Take 20 mg by mouth daily.         . simvastatin (ZOCOR) 40 MG tablet    Oral   Take 1 tablet (40 mg total) by mouth daily.   90 tablet   3   . traMADol (ULTRAM) 50 MG tablet   Oral   Take 1 tablet (50 mg total) by mouth every 6 (six) hours as needed.   12 tablet   0   . vitamin B-12 (CYANOCOBALAMIN) 1000 MCG tablet   Oral   Take 1,000 mcg by mouth daily.           Allergies Review of patient's allergies indicates no known allergies.  Family History  Problem Relation Age of Onset  . Heart disease Mother   . Heart attack Mother 60  . Heart disease Father   . Heart attack Father 71  . Heart disease Brother   . Heart attack Brother 59    Social History Social History  Substance Use Topics  . Smoking status: Former Smoker -- 1.00 packs/day for 40 years    Types: Cigarettes    Quit date: 09/07/2006  . Smokeless tobacco: Never Used  . Alcohol Use: No    Review of Systems  Constitutional: Negative for fever/chills Respiratory: Negative for shortness of breath. Musculoskeletal: Negative for pain. Skin: Positive for skin avulsion Neurological: Negative for headaches, focal weakness or numbness. ____________________________________________   PHYSICAL EXAM:  VITAL SIGNS: ED Triage Vitals  Enc Vitals Group     BP 08/08/15 1510 130/71 mmHg     Pulse Rate 08/08/15 1510 74     Resp 08/08/15 1510 20     Temp 08/08/15 1510 98.6 F (37 C)     Temp Source 08/08/15 1510 Oral     SpO2 08/08/15 1510 95 %     Weight 08/08/15 1502 208 lb (94.348 kg)     Height 08/08/15 1510 5\' 7"  (1.702 m)     Head Cir --      Peak Flow --      Pain Score --      Pain Loc --      Pain Edu? --      Excl. in [place]? --      Constitutional: Alert and oriented. Well appearing and in no acute distress. Eyes: Conjunctivae are normal. EOMI. Nose: No congestion/rhinnorhea. Mouth/Throat: Mucous membranes are moist.   Neck: No stridor. Cardiovascular: Good peripheral circulation. Respiratory: Normal respiratory effort.  No  retractions. Musculoskeletal: FROM  throughout. No bone exposure. Neurologic:  Normal speech and language. No gross focal neurologic deficits are appreciated. Skin:  Avulsion to the tip of the right ring finger with active bleeding.   ____________________________________________   LABS (all labs ordered are listed, but only abnormal results are displayed)  Labs Reviewed - No data to display ____________________________________________  EKG   ____________________________________________  RADIOLOGY  Not indicated. ____________________________________________   PROCEDURES  Procedure(s) performed:   Finger soaked in Xylocaine 1% with epi, betadine, and normal saline for 10 minutes then tourniquet applied just below the injury and finger was exsanguinated then skin adhesive was applied to a dry wound bed with partial hemostasis. Surgicel was then applied to the tip of the finger and a bulky bandage was placed with some compression to the tip. Successful hemostasis with the above. Aluminum foam splint applied by me. ____________________________________________   INITIAL IMPRESSION / ASSESSMENT AND PLAN / ED COURSE  Pertinent labs & imaging results that were available during my care of the patient were reviewed by me and considered in my medical decision making (see chart for details).  Patient will be advised to return to the emergency department if bleeding returns and in not controlled by pressure.  He was advised to follow up with PCP for concerns.    FINAL CLINICAL IMPRESSION(S) / ED DIAGNOSES  Final diagnoses:  Avulsion, finger tip, initial encounter    Discharge Medication List as of 08/08/2015  4:07 PM    START taking these medications   Details  traMADol (ULTRAM) 50 MG tablet Take 1 tablet (50 mg total) by mouth every 6 (six) hours as needed., Starting 08/08/2015, Until Discontinued, [place], FNP 08/08/15 1622  Daymon Larsen, MD 08/08/15 [phone]

## 2015-08-08 NOTE — Discharge Instructions (Signed)
Deep Skin Avulsion A deep skin avulsion is a type of open wound. It often results from a severe injury (trauma) that tears away all layers of the skin or an entire body part. The areas of the body that are most often affected by a deep skin avulsion include the face, lips, ears, nose, and fingers. A deep skin avulsion may make structures below the skin become visible. You may be able to see muscle, bone, nerves, and blood vessels. A deep skin avulsion can also damage important structures beneath the skin. These include tendons, ligaments, nerves, or blood vessels. CAUSES Injuries that often cause a deep skin avulsion include:  Being crushed.  Falling against a jagged surface.  Animal bites.  Gunshot wounds.  Severe burns.  Injuries that involve being dragged, such as bicycle or motorcycle accidents. SYMPTOMS Symptoms of a deep skin avulsion include:  Pain.  Numbness.  Swelling.  A misshapen body part.  Bleeding, which may be heavy.  Fluid leaking from the wound. DIAGNOSIS This condition may be diagnosed with a medical history and physical exam. You may also have X-rays done. TREATMENT The treatment that is chosen for a deep skin avulsion depends on how large and deep the wound is and where it is located. Treatment for all types of avulsions usually starts with:  Controlling the bleeding.  Washing out the wound with a germ-free (sterile) salt-water solution.  Removing dead tissue from the wound. A wound may be closed or left open to heal. This depends on the size and location of the wound and whether it is likely to become infected. Wounds are usually covered or closed if they expose blood vessels, nerves, bone, or cartilage.  Wounds that are small and clean may be closed with stitches (sutures).  Wounds that cannot be closed with sutures may be covered with a piece of skin (graft) or a skin flap. Skin may be taken from on or near the wound, from another part of the body,  or from a donor.  Wounds may be left open if they are hard to close or they may become infected. These wounds heal over time from the bottom up. You may also receive medicine. This may include:  Antibiotics.  A tetanus shot.  Rabies vaccine. HOME CARE INSTRUCTIONS Medicines  Take or apply over-the-counter and prescription medicines only as told by your health care provider.  If you were prescribed an antibiotic, take or apply it as told by your health care provider. Do not stop taking the antibiotic even if your condition improves.  You may get anti-itch medicine while your wound is healing. Use it only as told by your health care provider. Wound Care  There are many ways to close and cover a wound. For example, a wound can be covered with sutures, skin glue, or adhesive strips. Follow instructions from your health care provider about:  How to take care of your wound.  When and how you should change your bandage (dressing).  When you should remove your dressing.  Removing whatever was used to close your wound.  Keep the dressing dry as told by your health care provider. Do not take baths, swim, use a hot tub, or do anything that would put your wound underwater until your health care provider approves.  Clean the wound each day or as told by your health care provider.  Wash the wound with mild soap and water.  Rinse the wound with water to remove all soap.  Pat the wound   dry with a clean towel. Do not rub it.  Do not scratch or pick at the wound.  Check your wound every day for signs of infection. Watch for:  Redness, swelling, or pain.  Fluid, blood, or pus. General Instructions  Raise (elevate) the injured area above the level of your heart while you are sitting or lying down.  Keep all follow-up visits as told by your health care provider. This is important. SEEK MEDICAL CARE IF:  You received a tetanus shot and you have swelling, severe pain, redness, or  bleeding at the injection site.  You have a fever.  Your pain is not controlled with medicine.  You have increased redness, swelling, or pain at the site of your wound.  You have fluid, blood, or pus coming from your wound.  You notice a bad smell coming from your wound or your dressing.  A wound that was closed breaks open.  You notice something coming out of the wound, such as wood or glass.  You notice a change in the color of your skin near your wound.  You develop a new rash.  You need to change the dressing frequently due to fluid, blood, or pus draining from the wound. SEEK IMMEDIATE MEDICAL CARE IF:  Your pain suddenly increases and is severe.  You develop severe swelling around the wound.  You develop numbness around the wound.  You have nausea and vomiting that does not go away after 24 hours.  You feel light-headed, weak, or faint.  You develop chest pain.  You have trouble breathing.  Your wound is on your hand or foot and you cannot properly move a finger or toe.  The wound is on your hand or foot and you notice that your fingers or toes look pale or bluish.  You have a red streak going away from your wound.   This information is not intended to replace advice given to you by your health care provider. Make sure you discuss any questions you have with your health care provider.   Document Released: 03/16/2006 Document Revised: 06/04/2014 Document Reviewed: 01/23/2014 Elsevier Interactive Patient Education 2016 Elsevier Inc.  

## 2015-08-08 NOTE — ED Notes (Signed)
See triage note  States he cut the tip of mandiline   Laceration to right 4 th finger

## 2015-08-08 NOTE — ED Notes (Signed)
Pt states he was using a mandiline slicer and cut the tip of his right 4th finger off.

## 2015-09-15 ENCOUNTER — Encounter: Payer: Self-pay | Admitting: Cardiovascular Disease

## 2015-09-15 ENCOUNTER — Ambulatory Visit (INDEPENDENT_AMBULATORY_CARE_PROVIDER_SITE_OTHER): Payer: Medicare Other | Admitting: Cardiovascular Disease

## 2015-09-15 VITALS — BP 120/70 | HR 63 | Ht 67.0 in | Wt 219.2 lb

## 2015-09-15 DIAGNOSIS — E118 Type 2 diabetes mellitus with unspecified complications: Secondary | ICD-10-CM

## 2015-09-15 DIAGNOSIS — Z951 Presence of aortocoronary bypass graft: Secondary | ICD-10-CM | POA: Diagnosis not present

## 2015-09-15 DIAGNOSIS — E785 Hyperlipidemia, unspecified: Secondary | ICD-10-CM | POA: Diagnosis not present

## 2015-09-15 DIAGNOSIS — R0602 Shortness of breath: Secondary | ICD-10-CM

## 2015-09-15 DIAGNOSIS — I25708 Atherosclerosis of coronary artery bypass graft(s), unspecified, with other forms of angina pectoris: Secondary | ICD-10-CM | POA: Diagnosis not present

## 2015-09-15 DIAGNOSIS — Z9989 Dependence on other enabling machines and devices: Secondary | ICD-10-CM

## 2015-09-15 DIAGNOSIS — I714 Abdominal aortic aneurysm, without rupture, unspecified: Secondary | ICD-10-CM

## 2015-09-15 DIAGNOSIS — I10 Essential (primary) hypertension: Secondary | ICD-10-CM | POA: Diagnosis not present

## 2015-09-15 DIAGNOSIS — G4733 Obstructive sleep apnea (adult) (pediatric): Secondary | ICD-10-CM

## 2015-09-15 DIAGNOSIS — J432 Centrilobular emphysema: Secondary | ICD-10-CM

## 2015-09-15 NOTE — Progress Notes (Signed)
Cardiology Office Note  Date:  09/15/2015   ID:  Mario Ward, DOB 01-Jul-1945, MRN [id number]  PCP:  Mario Hartigan, MD   Chief Complaint  Patient presents with  . Other    6 month f/u. Meds reviewed verbally with pt.    HPI:  Mario Ward is a 70 year old gentleman with a history of coronary artery disease, bypass x4 on 02/08/1995, history of diabetes, long history of smoking for 40 years stopped 7 years ago, obesity, obstructive sleep apnea who is compliant with his CPAP, hyperlipidemia who presents for routine follow up of his coronary artery disease. Last cardiac catheterization April 2015 4.8 cm AAA, had a stent placed by Dr. Ronalee Belts  In follow-up today, reports that his respiratory status is stable  Reports that he Sees Dr. Raul Del for his pulmonary issues Dr. Raul Del "Stopped everything" No Inhalers, no change in his symptoms  On 06/24/2014 he Had bariatric surgery, performed by dr. Darnell Level Reports that his weight is Down 50 pounds over the past year Diabetes better Still on cpap because he sleeps better with the machine No regular exercise program but does count his steps, reports as he is very active around his house and in the yard. Not unusual to do 6000-10,000 steps  Reports having recent lab work to the [place], numbers not available Previous total cholesterol February 2017 was 169, LDL 96  EKG on today's visit shows normal sinus rhythm with rate 63 bpm, right bundle branch block   Other past medical history AAA stent placed 04/02/2015  upper endoscopy at [facility], diagnosis of well-differentiated neuroendocrine tumor, carcinoid tumor   prior cardiac catheterization in April 2015. Previously seen by Dr. [place] Bells.   05/02/2013, He developed chest discomfort, malaise, did not feel well. His wife called 54 and he was initially taken to a hospital in [place], and transferred to [facility] where he underwent cardiac catheterization  Cardiac catheterization showed  severe three-vessel coronary artery disease. He had an occluded RCA, occluded mid LAD, occluded marginal branch off the circumflex,  He had a vein graft to the diagonal, vein graft to the OM, occluded vein graft to the RCA, patent LIMA to the LAD. Medical management was recommended Ejection fraction estimated at 55%, normal wall motion.   Previous lab work; Hemoglobin A1c 6.4, low vitamin D, total cholesterol 137, LDL 65, HDL 37, triglycerides in the 170 range Bypass surgery was performed at [facility] in [facility]   PMH:   has a past medical history of AAA (abdominal aortic aneurysm) ([place]); Anginal pain ([place]); Arthritis; Cancer [facility]); COPD (chronic obstructive pulmonary disease) ([place]); Coronary artery disease (1997); Diabetes mellitus without complication ([place]); GERD (gastroesophageal reflux disease); Hypertension; Sleep apnea; and Vitamin D deficiency.  PSH:    Past Surgical History:  Procedure Laterality Date  . BARIATRIC SURGERY    . CARDIAC CATHETERIZATION  05/03/2013   [facility] in [place]  . CHOLECYSTECTOMY    . CORONARY ARTERY BYPASS GRAFT  1997   CABG x 4 in [place]   . EYE SURGERY Bilateral    Cataract Extraction with IOL  . JOINT REPLACEMENT Right    total knee replacement  . PERIPHERAL VASCULAR CATHETERIZATION N/A 04/02/2015   Procedure: Endovascular Repair/Stent Graft;  Surgeon: Katha Cabal, MD;  Location: [place] [facility];  Service: Cardiovascular;  Laterality: N/A;  . REPLACEMENT TOTAL KNEE     right    Current Outpatient Prescriptions  Medication Sig Dispense Refill  . albuterol (PROVENTIL) (2.5  MG/3ML) 0.083% nebulizer solution Take 2.5 mg by nebulization every 2 (two) hours as needed for wheezing or shortness of breath.    Marland Kitchen aspirin EC 81 MG tablet Take 81 mg by mouth daily.    . cholecalciferol (VITAMIN D) 1000 UNITS tablet Take 2,000 Units by mouth daily.    . clopidogrel (PLAVIX) 75 MG tablet Take 75 mg by mouth daily.    .  Ipratropium-Albuterol (COMBIVENT RESPIMAT) 20-100 MCG/ACT AERS respimat Inhale 1 puff into the lungs every 6 (six) hours.    Marland Kitchen lisinopril (PRINIVIL,ZESTRIL) 40 MG tablet Take 40 mg by mouth daily.    Marland Kitchen loratadine (CLARITIN) 10 MG tablet Take 10 mg by mouth daily.    . metoprolol tartrate (LOPRESSOR) 25 MG tablet Take 25 mg by mouth 2 (two) times daily.     . Multiple Vitamin (MULTIVITAMIN) tablet Take 1 tablet by mouth daily.    . nitroGLYCERIN (NITROSTAT) 0.4 MG SL tablet Place 0.4 mg under the tongue every 5 (five) minutes as needed for chest pain.    Marland Kitchen Olopatadine HCl (PATADAY) 0.2 % SOLN Apply 2 drops to eye as needed.     . RABEprazole (ACIPHEX) 20 MG tablet Take 20 mg by mouth daily.    . simvastatin (ZOCOR) 40 MG tablet Take 1 tablet (40 mg total) by mouth daily. 90 tablet 3  . vitamin B-12 (CYANOCOBALAMIN) 1000 MCG tablet Take 1,000 mcg by mouth daily.     No current facility-administered medications for this visit.      Allergies:   Review of patient's allergies indicates no known allergies.   Social History:  The patient  reports that he quit smoking about 9 years ago. His smoking use included Cigarettes. He has a 40.00 pack-year smoking history. He has never used smokeless tobacco. He reports that he does not drink alcohol or use drugs.   Family History:   family history includes Heart attack (age of onset: 70) in his brother; Heart attack (age of onset: 26) in his father; Heart attack (age of onset: 36) in his mother; Heart disease in his brother, father, and mother.    Review of Systems: Review of Systems  Constitutional: Negative.   Respiratory: Negative.   Cardiovascular: Negative.   Gastrointestinal: Negative.   Musculoskeletal: Negative.   Neurological: Negative.   Psychiatric/Behavioral: Negative.   All other systems reviewed and are negative.    PHYSICAL EXAM: VS:  BP 120/70 (BP Location: Left Arm, Patient Position: Sitting, Cuff Size: Large)   Pulse 63   Ht  5\' 7"  (1.702 m)   Wt 219 lb 4 oz (99.5 kg)   BMI 34.34 kg/m  , BMI Body mass index is 34.34 kg/m. GEN: Well nourished, well developed, in no acute distress , obese HEENT: normal  Neck: no JVD, carotid bruits, or masses Cardiac: RRR; no murmurs, rubs, or gallops,no edema  Respiratory:  clear to auscultation bilaterally, normal work of breathing GI: soft, nontender, nondistended, + BS MS: no deformity or atrophy  Skin: warm and dry, no rash Neuro:  Strength and sensation are intact Psych: euthymic mood, full affect    Recent Labs: 04/03/2015: BUN 12; Creatinine, Ser 0.81; Hemoglobin 12.6; Platelets 197; Potassium 3.6; Sodium 140    Lipid Panel No results found for: CHOL, HDL, LDLCALC, TRIG    Wt Readings from Last 3 Encounters:  09/15/15 219 lb 4 oz (99.5 kg)  08/08/15 214 lb (97.1 kg)  04/02/15 208 lb (94.3 kg)       ASSESSMENT  AND PLAN:  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 No further workup at this time. Continue current medication regimen.  Essential hypertension - Plan: EKG 12-Lead Blood pressure is well controlled on today's visit. No changes made to the medications.  Hyperlipidemia Long discussion concerning recent lipid panel earlier in 2017 which is above goal We will try to obtain his most recent lipid panel from the [facility]  Abdominal aortic aneurysm (AAA) without rupture [facility]) Recent endovascular stent, reports it went well Follow-up with vein and vascular  SOB (shortness of breath) on exertion Reports breathing has improved with 50 pound weight loss Encouraged regular exercise program, further weight loss  S/P CABG x 4 Currently with no symptoms of angina. No further workup at this time. Continue current medication regimen.  Type 2 diabetes mellitus with complication, without long-term current use of insulin ([place]) Reports diabetes  numbers have dramatically improved with recent 50 pound weight loss  Morbid obesity due to excess calories ([place]) History of bypass surgery in 2016  Centrilobular emphysema ([place]) Seen by pulmonary at [facility], currently not taking any inhalers, symptoms the same  OSA on CPAP Continues to sleep with CPAP. Reports that he is sleeping better with it    Total encounter time more than 25 minutes  Greater than 50% was spent in counseling and coordination of care with the patient   Disposition:   F/U  6 months   Orders Placed This Encounter  Procedures  . EKG 12-Lead     Signed, Esmond Plants, M.D., Ph.D. 09/15/2015  [place], [place]

## 2015-09-15 NOTE — Patient Instructions (Addendum)
Medication Instructions:   No medication changes  Labwork:  No new labs  Testing/Procedures:   No testing needed   Follow-Up: It was a pleasure seeing you in the office today. Please call us if you have new issues that need to be addressed before your next appt.  [phone]  Your physician wants you to follow-up in: 6 months.  You will receive a reminder letter in the mail two months in advance. If you don't receive a letter, please call our office to schedule the follow-up appointment.  If you need a refill on your cardiac medications before your next appointment, please call your pharmacy.

## 2015-09-18 ENCOUNTER — Encounter: Payer: Self-pay | Admitting: Cardiovascular Disease

## 2015-09-18 NOTE — Telephone Encounter (Signed)
This encounter was created in error - please disregard.

## 2015-09-18 NOTE — Telephone Encounter (Signed)
Pt called to let us know he just has his labwork done at his PCP and the results are in [place] through [place].

## 2015-09-18 NOTE — Telephone Encounter (Signed)
Pt had labs done thru PCP. In system.

## 2015-10-03 ENCOUNTER — Other Ambulatory Visit: Payer: Self-pay | Admitting: Cardiovascular Disease

## 2016-01-15 ENCOUNTER — Other Ambulatory Visit (INDEPENDENT_AMBULATORY_CARE_PROVIDER_SITE_OTHER): Payer: Self-pay | Admitting: Vascular Surgery

## 2016-01-15 DIAGNOSIS — IMO0001 Reserved for inherently not codable concepts without codable children: Secondary | ICD-10-CM

## 2016-01-15 DIAGNOSIS — I739 Peripheral vascular disease, unspecified: Secondary | ICD-10-CM

## 2016-01-15 DIAGNOSIS — I714 Abdominal aortic aneurysm, without rupture, unspecified: Secondary | ICD-10-CM

## 2016-01-15 DIAGNOSIS — T82330A Leakage of aortic (bifurcation) graft (replacement), initial encounter: Secondary | ICD-10-CM

## 2016-01-19 ENCOUNTER — Ambulatory Visit (INDEPENDENT_AMBULATORY_CARE_PROVIDER_SITE_OTHER): Payer: Medicare Other

## 2016-01-19 ENCOUNTER — Ambulatory Visit (INDEPENDENT_AMBULATORY_CARE_PROVIDER_SITE_OTHER): Payer: Medicare Other | Admitting: Vascular Surgery

## 2016-01-19 VITALS — BP 142/86 | HR 69 | Resp 16 | Wt 228.0 lb

## 2016-01-19 DIAGNOSIS — T82330A Leakage of aortic (bifurcation) graft (replacement), initial encounter: Secondary | ICD-10-CM | POA: Diagnosis not present

## 2016-01-19 DIAGNOSIS — I25708 Atherosclerosis of coronary artery bypass graft(s), unspecified, with other forms of angina pectoris: Secondary | ICD-10-CM | POA: Diagnosis not present

## 2016-01-19 DIAGNOSIS — J432 Centrilobular emphysema: Secondary | ICD-10-CM

## 2016-01-19 DIAGNOSIS — I10 Essential (primary) hypertension: Secondary | ICD-10-CM | POA: Diagnosis not present

## 2016-01-19 DIAGNOSIS — I739 Peripheral vascular disease, unspecified: Secondary | ICD-10-CM

## 2016-01-19 DIAGNOSIS — I714 Abdominal aortic aneurysm, without rupture, unspecified: Secondary | ICD-10-CM

## 2016-01-19 DIAGNOSIS — IMO0001 Reserved for inherently not codable concepts without codable children: Secondary | ICD-10-CM

## 2016-01-19 DIAGNOSIS — E118 Type 2 diabetes mellitus with unspecified complications: Secondary | ICD-10-CM

## 2016-01-19 NOTE — Progress Notes (Signed)
MRN : [id number]  Zaveon Nolton is a 70 y.o. (22-Feb-1945) male who presents with chief complaint of  Chief Complaint  Patient presents with  . Re-evaluation    Ultrasound follow up  .  History of Present Illness: The patient returns to the office for surveillance of an abdominal aortic aneurysm status post stent graft placement on 04/02/2015.   Patient denies abdominal pain or back pain, no other abdominal complaints. No groin related complaints. No symptoms consistent with distal embolization No changes in claudication distance.   There have been no interval changes in his overall healthcare since his last visit.   Patient denies amaurosis fugax or TIA symptoms. There is no history of claudication or rest pain symptoms of the lower extremities. The patient denies angina or shortness of breath.   Duplex US of the aorta and iliac arteries shows a 4.6 cm AAA sac with no endoleak, decrease in the sac compared to the previous study.  ABI's Rt=1.21 and Lt=1.26 no significant change  Current Meds  Medication Sig  . albuterol (PROVENTIL) (2.5 MG/3ML) 0.083% nebulizer solution Take 2.5 mg by nebulization every 2 (two) hours as needed for wheezing or shortness of breath.  Marland Kitchen aspirin EC 81 MG tablet Take 81 mg by mouth daily.  . cholecalciferol (VITAMIN D) 1000 UNITS tablet Take 2,000 Units by mouth daily.  . clopidogrel (PLAVIX) 75 MG tablet Take 75 mg by mouth daily.  . Ipratropium-Albuterol (COMBIVENT RESPIMAT) 20-100 MCG/ACT AERS respimat Inhale 1 puff into the lungs every 6 (six) hours.  Marland Kitchen lisinopril (PRINIVIL,ZESTRIL) 40 MG tablet Take 40 mg by mouth daily.  Marland Kitchen loratadine (CLARITIN) 10 MG tablet Take 10 mg by mouth daily.  . metoprolol tartrate (LOPRESSOR) 25 MG tablet Take 25 mg by mouth 2 (two) times daily.   . Multiple Vitamin (MULTIVITAMIN) tablet Take 1 tablet by mouth daily.  . nitroGLYCERIN (NITROSTAT) 0.4 MG SL tablet Place 0.4 mg under the tongue every 5 (five) minutes as needed  for chest pain.  Marland Kitchen Olopatadine HCl (PATADAY) 0.2 % SOLN Apply 2 drops to eye as needed.   . RABEprazole (ACIPHEX) 20 MG tablet Take 20 mg by mouth daily.  . simvastatin (ZOCOR) 40 MG tablet TAKE 1 TABLET DAILY  . vitamin B-12 (CYANOCOBALAMIN) 1000 MCG tablet Take 1,000 mcg by mouth daily.    Past Medical History:  Diagnosis Date  . AAA (abdominal aortic aneurysm) ([place])   . Anginal pain ([place])   . Arthritis   . 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 COPD (chronic obstructive pulmonary disease) ([place])   . Coronary artery disease 1997   CABG x 4   . Diabetes mellitus without complication [facility])    Patient states he had Gastric Bypass and is no longer on meds.  Marland Kitchen GERD (gastroesophageal reflux disease)   . Hypertension   . Sleep apnea    uses a CPAP at night.   . Vitamin D deficiency     Past Surgical History:  Procedure Laterality Date  . BARIATRIC SURGERY    . CARDIAC CATHETERIZATION  05/03/2013   [facility] in [place]  . CHOLECYSTECTOMY    . CORONARY ARTERY BYPASS GRAFT  1997   CABG x 4 in [place]   . EYE SURGERY Bilateral    Cataract Extraction with IOL  . JOINT REPLACEMENT Right    total knee replacement  . PERIPHERAL VASCULAR CATHETERIZATION N/A 04/02/2015   Procedure: Endovascular Repair/Stent Graft;  Surgeon: Katha Cabal, MD;  Location:  [place] [facility];  Service: Cardiovascular;  Laterality: N/A;  . REPLACEMENT TOTAL KNEE     right    Social History Social History  Substance Use Topics  . Smoking status: Former Smoker    Packs/day: 1.00    Years: 40.00    Types: Cigarettes    Quit date: 09/07/2006  . Smokeless tobacco: Never Used  . Alcohol use No    Family History Family History  Problem Relation Age of Onset  . Heart disease Mother   . Heart attack Mother 38  . Heart disease Father   . Heart attack Father 16  . Heart disease Brother   . Heart attack Brother 63  No family history of bleeding/clotting disorders, porphyria or autoimmune disease   No Known  Allergies   REVIEW OF SYSTEMS (Negative unless checked)  Constitutional: [] Weight loss  [] Fever  [] Chills Cardiac: [] Chest pain   [] Chest pressure   [] Palpitations   [] Shortness of breath when laying flat   [] Shortness of breath with exertion. Vascular:  [] Pain in legs with walking   [] Pain in legs at rest  [] History of DVT   [] Phlebitis   [] Swelling in legs   [] Varicose veins   [] Non-healing ulcers Pulmonary:   [] Uses home oxygen   [] Productive cough   [] Hemoptysis   [] Wheeze  [] COPD   [] Asthma Neurologic:  [] Dizziness   [] Seizures   [] History of stroke   [] History of TIA  [] Aphasia   [] Vissual changes   [] Weakness or numbness in arm   [] Weakness or numbness in leg Musculoskeletal:   [] Joint swelling   [] Joint pain   [] Low back pain Hematologic:  [] Easy bruising  [] Easy bleeding   [] Hypercoagulable state   [] Anemic Gastrointestinal:  [] Diarrhea   [] Vomiting  [] Gastroesophageal reflux/heartburn   [] Difficulty swallowing. Genitourinary:  [] Chronic kidney disease   [] Difficult urination  [] Frequent urination   [] Blood in urine Skin:  [] Rashes   [] Ulcers  Psychological:  [] History of anxiety   []  History of major depression.  Physical Examination  Vitals:   01/19/16 0840  BP: (!) 142/86  Pulse: 69  Resp: 16  Weight: 228 lb (103.4 kg)   Body mass index is 35.71 kg/m. Gen: WD/WN, NAD Head: [place]/AT, No temporalis wasting.  Ear/Nose/Throat: Hearing grossly intact, nares w/o erythema or drainage, poor dentition Eyes: PER, EOMI, sclera nonicteric.  Neck: Supple, no masses.  No bruit or JVD.  Pulmonary:  Good air movement, clear to auscultation bilaterally, no use of accessory muscles.  Cardiac: RRR, normal S1, S2, no Murmurs. Vascular:  Vessel Right Left  Radial Palpable Palpable  Ulnar Palpable Palpable  Brachial Palpable Palpable  Carotid Palpable Palpable  Femoral Palpable Palpable  Popliteal Palpable Palpable  PT Palpable Palpable  DP Palpable Palpable   Gastrointestinal: soft,  non-distended. No guarding/no peritoneal signs.  Musculoskeletal: M/S 5/5 throughout.  No deformity or atrophy.  Neurologic: CN 2-12 intact. Pain and light touch intact in extremities.  Symmetrical.  Speech is fluent. Motor exam as listed above. Psychiatric: Judgment intact, Mood & affect appropriate for pt's clinical situation. Dermatologic: No rashes or ulcers noted.  No changes consistent with cellulitis. Lymph : No Cervical lymphadenopathy, no lichenification or skin changes of chronic lymphedema.  CBC Lab Results  Component Value Date   WBC 9.2 04/03/2015   HGB 12.6 (L) 04/03/2015   HCT 36.7 (L) 04/03/2015   MCV 89.1 04/03/2015   PLT 197 04/03/2015    BMET    Component Value Date/Time   NA 140  04/03/2015 0406   K 3.6 04/03/2015 0406   CL 107 04/03/2015 0406   CO2 26 04/03/2015 0406   GLUCOSE 117 (H) 04/03/2015 0406   BUN 12 04/03/2015 0406   CREATININE 0.81 04/03/2015 0406   CALCIUM 9.0 04/03/2015 0406   GFRNONAA >60 04/03/2015 0406   GFRAA >60 04/03/2015 0406   CrCl cannot be calculated (Patient's most recent lab result is older than the maximum 21 days allowed.).  COAG Lab Results  Component Value Date   INR 1.10 03/27/2015    Radiology No results found.  Assessment/Plan 1. Abdominal aortic aneurysm (AAA) without rupture (HCC) Recommend: Patient is status post successful endovascular repair of the AAA.   No further intervention is required at this time.   No endoleak is detected and the aneurysm sac is stable.  The patient will continue antiplatelet therapy as prescribed as well as aggressive management of hyperlipidemia. Exercise is again strongly encouraged.   However, endografts require continued surveillance with ultrasound or CT scan. This is mandatory to detect any changes that allow repressurization of the aneurysm sac.  The patient is informed that this would be asymptomatic.  The patient is reminded that lifelong routine surveillance is a necessity  with an endograft. Patient will continue to follow-up at 6 month intervals with ultrasound of the aorta.  - VAS [place] ABI WITH/WO TBI; Future - VAS US AORTA/IVC/ILIACS; Future  2. Essential hypertension Continue antihypertensive medications as already ordered and reviewed, no changes at this time.  Continue statin as ordered and reviewed, no changes at this time  3. Type 2 diabetes mellitus with complication, without long-term current use of insulin (HCC) Continue hypoglycemic medications as already ordered and reviewed, no changes at this time.  4. Centrilobular emphysema ([place]) Continue aerosol medications as already ordered and reviewed, no changes at this time.    Hortencia Pilar, MD  01/19/2016 9:05 AM

## 2016-03-15 ENCOUNTER — Ambulatory Visit: Payer: Medicare Other | Admitting: Cardiovascular Disease

## 2016-03-18 ENCOUNTER — Encounter: Payer: Self-pay | Admitting: Urology

## 2016-03-18 ENCOUNTER — Ambulatory Visit (INDEPENDENT_AMBULATORY_CARE_PROVIDER_SITE_OTHER): Payer: Medicare Other | Admitting: Urology

## 2016-03-18 VITALS — BP 133/81 | HR 67 | Ht 67.0 in | Wt 225.3 lb

## 2016-03-18 DIAGNOSIS — N5201 Erectile dysfunction due to arterial insufficiency: Secondary | ICD-10-CM | POA: Diagnosis not present

## 2016-03-18 DIAGNOSIS — N400 Enlarged prostate without lower urinary tract symptoms: Secondary | ICD-10-CM

## 2016-03-18 MED ORDER — TAMSULOSIN HCL 0.4 MG PO CAPS: 0.4000 mg | ORAL_CAPSULE | Freq: Every day | ORAL | 3 refills | Status: DC

## 2016-03-18 NOTE — Progress Notes (Signed)
03/18/2016 12:18 PM   Mario Ward 1945-04-21 [id number]  Referring provider: Sofie Hartigan, MD [facility] [place],  [address]  Chief Complaint  Patient presents with  . New Patient (Initial Visit)    penile implant     HPI: The patient is a 71 year old gentleman with a history of AAA status post stent, CABG 4, diabetes, sleep apnea on CPAP, CAD who presents today for evaluation of his penile prosthesis.    1. Erectile dysfunction The patient presents with concerns with his inflatable penile prosthesis. He was placed 24 years ago. He is no longer using it is not sexually active for at least 3 years or longer. He is not interested in obtaining erections. His concern now though is that approximate 3-4 times per week his penile prosthesis will randomly inflate and he will need to deflate it manually. He finds is socially awkward. However, he does not find this bothersome enough to undergo surgery. He denies any sign of infection or erosion.  2. BPH The patient was recently started on Flomax for BPH symptoms. This has greatly improved his symptoms. Currently has nocturia 1. He feels that he empties his bladder he has a good stream. He is not currently leaking urine. Prior to instituting this medication, his stream is intermittent and he felt like he was not fully emptying his bladder. He is very happy with how he is improved with this medication. He elected to transfer his care for BPH to [place] at this office for [place] to continue his care with Flomax.   PMH: Past Medical History:  Diagnosis Date  . AAA (abdominal aortic aneurysm) ([place])   . Anginal pain ([place])   . Arthritis   . 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 COPD (chronic obstructive pulmonary disease) ([place])   . Coronary artery disease 1997   CABG x 4   . Diabetes mellitus without complication [facility])    Patient states he had Gastric Bypass and is no longer on meds.  Marland Kitchen GERD  (gastroesophageal reflux disease)   . Hypertension   . Sleep apnea    uses a CPAP at night.   . Vitamin D deficiency     Surgical History: Past Surgical History:  Procedure Laterality Date  . BARIATRIC SURGERY    . CARDIAC CATHETERIZATION  05/03/2013   [facility] in [place]  . CHOLECYSTECTOMY    . CORONARY ARTERY BYPASS GRAFT  1997   CABG x 4 in [place]   . EYE SURGERY Bilateral    Cataract Extraction with IOL  . JOINT REPLACEMENT Right    total knee replacement  . PENILE PROSTHESIS IMPLANT    . PERIPHERAL VASCULAR CATHETERIZATION N/A 04/02/2015   Procedure: Endovascular Repair/Stent Graft;  Surgeon: Katha Cabal, MD;  Location: [place] [facility];  Service: Cardiovascular;  Laterality: N/A;  . REPLACEMENT TOTAL KNEE     right    Home Medications:  Allergies as of 03/18/2016   No Known Allergies     Medication List       Accurate as of 03/18/16 12:18 PM. Always use your most recent med list.          albuterol (2.5 MG/3ML) 0.083% nebulizer solution Commonly known as:  PROVENTIL Take 2.5 mg by nebulization every 2 (two) hours as needed for wheezing or shortness of breath.   aspirin EC 81 MG tablet Take 81 mg by mouth daily.   cholecalciferol 1000 units tablet  Commonly known as:  VITAMIN D Take 2,000 Units by mouth daily.   clopidogrel 75 MG tablet Commonly known as:  PLAVIX Take 75 mg by mouth daily.   COMBIVENT RESPIMAT 20-100 MCG/ACT Aers respimat Generic drug:  Ipratropium-Albuterol Inhale 1 puff into the lungs every 6 (six) hours.   lisinopril 40 MG tablet Commonly known as:  PRINIVIL,ZESTRIL Take 40 mg by mouth daily.   loratadine 10 MG tablet Commonly known as:  CLARITIN Take 10 mg by mouth daily.   metoprolol tartrate 25 MG tablet Commonly known as:  LOPRESSOR Take 25 mg by mouth 2 (two) times daily.   multivitamin tablet Take 1 tablet by mouth daily.   nitroGLYCERIN 0.4 MG SL tablet Commonly known as:  NITROSTAT Place 0.4 mg under  the tongue every 5 (five) minutes as needed for chest pain.   PATADAY 0.2 % Soln Generic drug:  Olopatadine HCl Apply 2 drops to eye as needed.   RABEprazole 20 MG tablet Commonly known as:  ACIPHEX Take 20 mg by mouth daily.   simvastatin 40 MG tablet Commonly known as:  ZOCOR TAKE 1 TABLET DAILY   tamsulosin 0.4 MG Caps capsule Commonly known as:  FLOMAX Take 0.4 mg by mouth.   vitamin B-12 1000 MCG tablet Commonly known as:  CYANOCOBALAMIN Take 1,000 mcg by mouth daily.       Allergies: No Known Allergies  Family History: Family History  Problem Relation Age of Onset  . Heart disease Mother   . Heart attack Mother 22  . Heart disease Father   . Heart attack Father 62  . Heart disease Brother   . Heart attack Brother 38    Social History:  reports that he quit smoking about 9 years ago. His smoking use included Cigarettes. He has a 40.00 pack-year smoking history. He has never used smokeless tobacco. He reports that he does not drink alcohol or use drugs.  ROS: UROLOGY Frequent Urination?: No Hard to postpone urination?: No Burning/pain with urination?: No Get up at night to urinate?: Yes Leakage of urine?: Yes Urine stream starts and stops?: Yes Trouble starting stream?: No Do you have to strain to urinate?: No Blood in urine?: No Urinary tract infection?: No Sexually transmitted disease?: No Injury to kidneys or bladder?: No Painful intercourse?: No Weak stream?: No Erection problems?: Yes Penile pain?: No  Gastrointestinal Nausea?: No Vomiting?: No Indigestion/heartburn?: No Diarrhea?: No Constipation?: No  Constitutional Fever: No Night sweats?: No Weight loss?: Yes Fatigue?: No  Skin Skin rash/lesions?: No Itching?: No  Eyes Blurred vision?: No Double vision?: No  Ears/Nose/Throat Sore throat?: No Sinus problems?: No  Hematologic/Lymphatic Swollen glands?: No Easy bruising?: Yes  Cardiovascular Leg swelling?: No Chest  pain?: Yes  Respiratory Cough?: No Shortness of breath?: Yes  Endocrine Excessive thirst?: No  Musculoskeletal Back pain?: No Joint pain?: Yes  Neurological Headaches?: No Dizziness?: Yes  Psychologic Depression?: No Anxiety?: No  Physical Exam: BP 133/81   Pulse 67   Ht 5\' 7"  (1.702 m)   Wt 225 lb 4.8 oz (102.2 kg)   BMI 35.29 kg/m   Constitutional:  Alert and oriented, No acute distress. HEENT: [place] AT, moist mucus membranes.  Trachea midline, no masses. Cardiovascular: No clubbing, cyanosis, or edema. Respiratory: Normal respiratory effort, no increased work of breathing. GI: Abdomen is soft, nontender, nondistended, no abdominal masses GU: No CVA tenderness. 3 piece penile prosthesis in place. No sign of erosion or infection. The tubes the reservoir palpable and within the  inguinal canal on the left but the reservoir is not. The pump is appropriately located in the inferior hemiscrotum. Skin: No rashes, bruises or suspicious lesions. Lymph: No cervical or inguinal adenopathy. Neurologic: Grossly intact, no focal deficits, moving all 4 extremities. Psychiatric: Normal mood and affect.  Laboratory Data: Lab Results  Component Value Date   WBC 9.2 04/03/2015   HGB 12.6 (L) 04/03/2015   HCT 36.7 (L) 04/03/2015   MCV 89.1 04/03/2015   PLT 197 04/03/2015    Lab Results  Component Value Date   CREATININE 0.81 04/03/2015    No results found for: PSA  No results found for: TESTOSTERONE  No results found for: HGBA1C  Urinalysis No results found for: COLORURINE, APPEARANCEUR, LABSPEC, PHURINE, GLUCOSEU, HGBUR, BILIRUBINUR, KETONESUR, PROTEINUR, UROBILINOGEN, NITRITE, LEUKOCYTESUR   Assessment & Plan:    1. Auto inflation of penile prosthesis The patient is having problems with auto inflation of his prosthesis approximately 3-4 times per week. We discussed management options which would be observation versus surgical excision. He would need cardiac clearance  and come off his blood thinners prior to this procedure. At this time, the patient is not bothered by the auto inflation to want to undergo a surgical procedure, so he is elected to monitor this conservatively. If this problem worsens or becomes more frequent, he will address possible surgery at that time.  2. BPH Continue Flomax. I have sent a new prescription for the patient as he would like to have his BPH treated here. He'll follow up annually or sooner if symptoms worsen.   Return in about 1 year (around 03/18/2017).  Nickie Retort, MD  [facility] [address] [place], [place] [address] [phone]

## 2016-03-23 ENCOUNTER — Ambulatory Visit (INDEPENDENT_AMBULATORY_CARE_PROVIDER_SITE_OTHER): Payer: Medicare Other | Admitting: Cardiovascular Disease

## 2016-03-23 ENCOUNTER — Encounter: Payer: Self-pay | Admitting: Cardiovascular Disease

## 2016-03-23 VITALS — BP 138/86 | HR 73 | Ht 67.0 in | Wt 226.5 lb

## 2016-03-23 DIAGNOSIS — E785 Hyperlipidemia, unspecified: Secondary | ICD-10-CM

## 2016-03-23 DIAGNOSIS — Z9989 Dependence on other enabling machines and devices: Secondary | ICD-10-CM

## 2016-03-23 DIAGNOSIS — Z951 Presence of aortocoronary bypass graft: Secondary | ICD-10-CM | POA: Diagnosis not present

## 2016-03-23 DIAGNOSIS — I10 Essential (primary) hypertension: Secondary | ICD-10-CM

## 2016-03-23 DIAGNOSIS — G4733 Obstructive sleep apnea (adult) (pediatric): Secondary | ICD-10-CM

## 2016-03-23 DIAGNOSIS — J9611 Chronic respiratory failure with hypoxia: Secondary | ICD-10-CM

## 2016-03-23 NOTE — Progress Notes (Signed)
Cardiology Office Note  Date:  03/23/2016   ID:  Mario Ward, DOB 04/04/1945, MRN [id number]  PCP:  Sofie Hartigan, MD   Chief Complaint  Patient presents with  . other    6 month f/u.  Meds verbally reviewed w/ pt.     HPI:  Mario Ward is a 71 year old gentleman with a history of coronary artery disease, bypass x4 on 02/08/1995, history of diabetes, long history of smoking for 40 years stopped 7 years ago, obesity, obstructive sleep apnea who is compliant with his CPAP, hyperlipidemia who presents for routine follow up of his coronary artery disease/CABG and PAD Last cardiac catheterization April 2015 4.8 cm AAA, had a stent placed by Dr. Ronalee Belts diagnosis of well-differentiated neuroendocrine tumor, carcinoid tumor, followed by [facility] doctor Jowell  In follow-up today he reports that he feels well Denies any significant shortness of breath on exertion, no chest pain concerning for angina Periodically walks 20 to 30 min on treadmill, no change in exercise tolerance Weight is up 12 pounds from his prior clinic visit February 2017  He has periodic follow-up with Dr. Raul Del for pulmonary issues Uses inhaler PRN, not on a regular basis  Reports he is scheduled to see Dr. Francella Solian at [facility], needs to do annual EGD Procedure 04/15/2016  Lab work reviewed with him in detail HBA1C 6.0  He has periodic follow-up and lab work at the [facility]  EKG on today's visit shows normal sinus rhythm with no significant ST or T-wave changes  Other past medical history reviewed On 06/24/2014 he Had bariatric surgery, performed by dr. Darnell Level Still on cpap because he sleeps better with the machine   total cholesterol February 2017 was 169, LDL 96 Follow-up LDL of 80  AAA stent placed 04/02/2015  upper endoscopy at [facility], diagnosis of well-differentiated neuroendocrine tumor, carcinoid tumor  prior cardiac catheterization in April 2015.  05/02/2013, He developed chest  discomfort, malaise, did not feel well. His wife called 14 and he was initially taken to a hospital in [place], and transferred to [facility] where he underwent cardiac catheterization  Cardiac catheterization showed severe three-vessel coronary artery disease. He had an occluded RCA, occluded mid LAD, occluded marginal branch off the circumflex,  He had a vein graft to the diagonal, vein graft to the OM, occluded vein graft to the RCA, patent LIMA to the LAD. Medical management was recommended Ejection fraction estimated at 55%, normal wall motion.   Bypass surgery was performed at [facility] in [facility]   PMH:   has a past medical history of AAA (abdominal aortic aneurysm) ([place]); Anginal pain ([place]); Arthritis; Cancer [facility]); COPD (chronic obstructive pulmonary disease) ([place]); Coronary artery disease (1997); Diabetes mellitus without complication ([place]); GERD (gastroesophageal reflux disease); Hypertension; Sleep apnea; and Vitamin D deficiency.  PSH:    Past Surgical History:  Procedure Laterality Date  . BARIATRIC SURGERY    . CARDIAC CATHETERIZATION  05/03/2013   [facility] in [place]  . CHOLECYSTECTOMY    . CORONARY ARTERY BYPASS GRAFT  1997   CABG x 4 in [place]   . EYE SURGERY Bilateral    Cataract Extraction with IOL  . JOINT REPLACEMENT Right    total knee replacement  . PENILE PROSTHESIS IMPLANT    . PERIPHERAL VASCULAR CATHETERIZATION N/A 04/02/2015   Procedure: Endovascular Repair/Stent Graft;  Surgeon: Katha Cabal, MD;  Location: [place] [facility];  Service: Cardiovascular;  Laterality: N/A;  . REPLACEMENT TOTAL KNEE  right    Current Outpatient Prescriptions  Medication Sig Dispense Refill  . albuterol (PROVENTIL) (2.5 MG/3ML) 0.083% nebulizer solution Take 2.5 mg by nebulization every 2 (two) hours as needed for wheezing or shortness of breath.    Marland Kitchen aspirin EC 81 MG tablet Take 81 mg by mouth daily.    . clopidogrel (PLAVIX) 75  MG tablet Take 75 mg by mouth daily.    . Ipratropium-Albuterol (COMBIVENT RESPIMAT) 20-100 MCG/ACT AERS respimat Inhale 1 puff into the lungs every 6 (six) hours.    Marland Kitchen lisinopril (PRINIVIL,ZESTRIL) 40 MG tablet Take 40 mg by mouth daily.    . metoprolol tartrate (LOPRESSOR) 25 MG tablet Take 25 mg by mouth 2 (two) times daily.     . Multiple Vitamin (MULTIVITAMIN) tablet Take 1 tablet by mouth daily.    . nitroGLYCERIN (NITROSTAT) 0.4 MG SL tablet Place 0.4 mg under the tongue every 5 (five) minutes as needed for chest pain.    Marland Kitchen Olopatadine HCl (PATADAY) 0.2 % SOLN Apply 2 drops to eye as needed.     . RABEprazole (ACIPHEX) 20 MG tablet Take 20 mg by mouth daily.    . simvastatin (ZOCOR) 40 MG tablet TAKE 1 TABLET DAILY 90 tablet 3  . tamsulosin (FLOMAX) 0.4 MG CAPS capsule Take 1 capsule (0.4 mg total) by mouth daily. 90 capsule 3  . vitamin B-12 (CYANOCOBALAMIN) 1000 MCG tablet Take 1,000 mcg by mouth daily.    . cholecalciferol (VITAMIN D) 1000 UNITS tablet Take 2,000 Units by mouth daily.    Marland Kitchen loratadine (CLARITIN) 10 MG tablet Take 10 mg by mouth daily.     No current facility-administered medications for this visit.      Allergies:   Patient has no known allergies.   Social History:  The patient  reports that he quit smoking about 9 years ago. His smoking use included Cigarettes. He has a 40.00 pack-year smoking history. He has never used smokeless tobacco. He reports that he does not drink alcohol or use drugs.   Family History:   family history includes Heart attack (age of onset: 43) in his brother; Heart attack (age of onset: 50) in his father; Heart attack (age of onset: 47) in his mother; Heart disease in his brother, father, and mother.    Review of Systems: Review of Systems  Constitutional: Negative.   Respiratory: Negative.   Cardiovascular: Negative.   Gastrointestinal: Negative.   Musculoskeletal: Negative.   Neurological: Negative.   Psychiatric/Behavioral:  Negative.   All other systems reviewed and are negative.    PHYSICAL EXAM: VS:  BP 138/86 (BP Location: Left Arm, Patient Position: Sitting, Cuff Size: Normal)   Pulse 73   Ht 5\' 7"  (1.702 m)   Wt 226 lb 8 oz (102.7 kg)   BMI 35.47 kg/m  , BMI Body mass index is 35.47 kg/m. GEN: Well nourished, well developed, in no acute distress , obese HEENT: normal  Neck: no JVD, carotid bruits, or masses Cardiac: RRR; no murmurs, rubs, or gallops,no edema  Respiratory:  clear to auscultation bilaterally, normal work of breathing GI: soft, nontender, nondistended, + BS MS: no deformity or atrophy  Skin: warm and dry, no rash Neuro:  Strength and sensation are intact Psych: euthymic mood, full affect    Recent Labs: 04/03/2015: BUN 12; Creatinine, Ser 0.81; Hemoglobin 12.6; Platelets 197; Potassium 3.6; Sodium 140    Lipid Panel No results found for: CHOL, HDL, LDLCALC, TRIG    Wt Readings from Last  3 Encounters:  03/23/16 226 lb 8 oz (102.7 kg)  03/18/16 225 lb 4.8 oz (102.2 kg)  01/19/16 228 lb (103.4 kg)       ASSESSMENT AND PLAN:  Essential hypertension - Plan: EKG 12-Lead Blood pressure is well controlled on today's visit. No changes made to the medications.  Hyperlipidemia, unspecified hyperlipidemia type - Plan: EKG 12-Lead LDL above goal, recommended we review his new lab work when this becomes available If numbers continue to run high, we could potentially add Zetia  Hx of CABG Discussed anginal symptoms with him Long discussion concerning Plavix He is aware of increased risk of bleeding by taking Plavix but does not want to change his medications at this time as he is doing well  OSA on CPAP Tolerating his CPAP we have recommended weight loss  Chronic hypoxemic respiratory failure ([place])  rarely uses inhalers , managed by pulmonary, Dr. Raul Del   Morbid obesity ([place])  weight is up 12 pounds, dietary indiscretion   recommended he start regular exercise  program   Total encounter time more than 25 minutes  Greater than 50% was spent in counseling and coordination of care with the patient   Disposition:   F/U  6 months   Orders Placed This Encounter  Procedures  . EKG 12-Lead     Signed, Esmond Plants, M.D., Ph.D. 03/23/2016  [place], [place]

## 2016-03-23 NOTE — Patient Instructions (Addendum)
We will write a letter for preop, EGD, Hold plavix for 5 days before the procedure  Copy of EKG  Medication Instructions:   No medication changes made  Labwork:  No new labs needed  Testing/Procedures:  No further testing at this time   I recommend watching educational videos on topics of interest to you at:       [email or web address]  Enter code: [email or web address]    Follow-Up: It was a pleasure seeing you in the office today. Please call us if you have new issues that need to be addressed before your next appt.  [phone]  Your physician wants you to follow-up in: 6 months.  You will receive a reminder letter in the mail two months in advance. If you don't receive a letter, please call our office to schedule the follow-up appointment.  If you need a refill on your cardiac medications before your next appointment, please call your pharmacy.

## 2016-03-26 ENCOUNTER — Encounter: Payer: Self-pay | Admitting: Cardiovascular Disease

## 2016-03-29 ENCOUNTER — Other Ambulatory Visit: Payer: Self-pay

## 2016-03-29 MED ORDER — EZETIMIBE 10 MG PO TABS: 10.0000 mg | ORAL_TABLET | Freq: Every day | ORAL | 3 refills | Status: DC

## 2016-03-29 NOTE — Progress Notes (Signed)
zet

## 2016-05-11 ENCOUNTER — Other Ambulatory Visit: Payer: Self-pay | Admitting: General Surgery

## 2016-05-11 DIAGNOSIS — D3A8 Other benign neuroendocrine tumors: Secondary | ICD-10-CM

## 2016-05-11 DIAGNOSIS — R935 Abnormal findings on diagnostic imaging of other abdominal regions, including retroperitoneum: Secondary | ICD-10-CM

## 2016-05-11 DIAGNOSIS — D3A010 Benign carcinoid tumor of the duodenum: Secondary | ICD-10-CM

## 2016-05-13 ENCOUNTER — Ambulatory Visit
Admission: RE | Admit: 2016-05-13 | Discharge: 2016-05-13 | Disposition: A | Discharge status: Home / Self Care | Payer: Medicare Other | Source: Ambulatory Visit | Attending: General Surgery | Admitting: General Surgery

## 2016-05-13 DIAGNOSIS — I714 Abdominal aortic aneurysm, without rupture: Secondary | ICD-10-CM | POA: Diagnosis not present

## 2016-05-13 DIAGNOSIS — Z951 Presence of aortocoronary bypass graft: Secondary | ICD-10-CM | POA: Diagnosis not present

## 2016-05-13 DIAGNOSIS — D3A010 Benign carcinoid tumor of the duodenum: Secondary | ICD-10-CM | POA: Insufficient documentation

## 2016-05-13 DIAGNOSIS — R935 Abnormal findings on diagnostic imaging of other abdominal regions, including retroperitoneum: Secondary | ICD-10-CM

## 2016-05-13 DIAGNOSIS — Z9889 Other specified postprocedural states: Secondary | ICD-10-CM | POA: Diagnosis not present

## 2016-05-13 DIAGNOSIS — I700 Atherosclerosis of aorta: Secondary | ICD-10-CM | POA: Insufficient documentation

## 2016-05-13 DIAGNOSIS — D3A8 Other benign neuroendocrine tumors: Secondary | ICD-10-CM

## 2016-05-13 HISTORY — DX: Malignant neoplasm of duodenum: C17.0

## 2016-05-13 LAB — POCT I-STAT CREATININE: CREATININE: 0.8 mg/dL (ref 0.61–1.24)

## 2016-05-13 MED ORDER — IOPAMIDOL (ISOVUE-300) INJECTION 61%
100.0000 mL | Freq: Once | INTRAVENOUS | Status: AC | PRN
  Administered 2016-05-13: 100 mL via INTRAVENOUS

## 2016-06-21 ENCOUNTER — Other Ambulatory Visit: Payer: Self-pay | Admitting: Cardiovascular Disease

## 2016-07-21 DIAGNOSIS — I2510 Atherosclerotic heart disease of native coronary artery without angina pectoris: Secondary | ICD-10-CM | POA: Insufficient documentation

## 2016-07-21 NOTE — Progress Notes (Signed)
error 

## 2016-07-22 ENCOUNTER — Ambulatory Visit (INDEPENDENT_AMBULATORY_CARE_PROVIDER_SITE_OTHER): Payer: Medicare Other | Admitting: Vascular Surgery

## 2016-07-22 ENCOUNTER — Ambulatory Visit (INDEPENDENT_AMBULATORY_CARE_PROVIDER_SITE_OTHER): Payer: Medicare Other

## 2016-07-22 ENCOUNTER — Encounter (INDEPENDENT_AMBULATORY_CARE_PROVIDER_SITE_OTHER): Payer: Self-pay | Admitting: Vascular Surgery

## 2016-07-22 VITALS — BP 144/88 | HR 58 | Resp 16 | Wt 232.0 lb

## 2016-07-22 DIAGNOSIS — I714 Abdominal aortic aneurysm, without rupture, unspecified: Secondary | ICD-10-CM

## 2016-07-22 DIAGNOSIS — E118 Type 2 diabetes mellitus with unspecified complications: Secondary | ICD-10-CM | POA: Diagnosis not present

## 2016-07-22 DIAGNOSIS — E785 Hyperlipidemia, unspecified: Secondary | ICD-10-CM

## 2016-07-22 DIAGNOSIS — I10 Essential (primary) hypertension: Secondary | ICD-10-CM

## 2016-07-22 NOTE — Progress Notes (Signed)
[facility]  Daily Progress Note   Patient presents for a yearly AAA which was endovascularly repaired on 04/02/2015. He presents today with wife. He presents without complaint today. The patient underwent an ER which was notable for a decrease in abdominal aortic aneurysm measuring 4.1 cm x 4.4 cm and no evidence of an endoleak. The patient underwent a bilateral lower arterial duplex which was notable for no significant lower extremity arterial disease and bilateral toe brachial indices being normal. When compared to his previous studies on 01/19/2016 there has been no change  Objective: Vitals:   07/22/16 0846  BP: (!) 144/88  Pulse: (!) 58  Resp: 16  Weight: 232 lb (105.2 kg)    Physical Exam: A&Ox3, NAD CV: RRR Pulmonary: CTA Bilaterally Abdomen: Soft, Nontender, Nondistended Vascular: DP & PT pules are 2+   Laboratory: CBC    Component Value Date/Time   WBC 9.2 04/03/2015 0406   HGB 12.6 (L) 04/03/2015 0406   HCT 36.7 (L) 04/03/2015 0406   PLT 197 04/03/2015 0406    BMET    Component Value Date/Time   NA 140 04/03/2015 0406   K 3.6 04/03/2015 0406   CL 107 04/03/2015 0406   CO2 26 04/03/2015 0406   GLUCOSE 117 (H) 04/03/2015 0406   BUN 12 04/03/2015 0406   CREATININE 0.80 05/13/2016 1539   CALCIUM 9.0 04/03/2015 0406   GFRNONAA >60 04/03/2015 0406   GFRAA >60 04/03/2015 0406   Assessment/Planning: Patient presents for a yearly AAA which was endovascularly repaired on 04/02/2015. He presents today with wife. He presents without complaint today. The patient underwent an ER which was notable for a decrease in abdominal aortic aneurysm measuring 4.1 cm x 4.4 cm and no evidence of an endoleak. The patient underwent a bilateral lower arterial duplex which was notable for no significant lower extremity arterial disease and bilateral toe brachial indices being normal. When compared to his previous studies on 01/19/2016 there has been no change  1)  AAA - stable Patient with stable duplex today Patient without complaint Physical exam unremarkable We'll continue to follow on a yearly basis with an EVAR & ABI  2) HTN: Stable Encouraged good control as its slows the progression of atherosclerotic disease  3) Hyperlipidemia: Stable Encouraged good control as its slows the progression of atherosclerotic disease  Marcelle Overlie PA-C 07/22/2016 9:40 AM

## 2016-09-07 ENCOUNTER — Ambulatory Visit (INDEPENDENT_AMBULATORY_CARE_PROVIDER_SITE_OTHER): Payer: Medicare Other | Admitting: Urology

## 2016-09-07 ENCOUNTER — Encounter: Payer: Self-pay | Admitting: Urology

## 2016-09-07 VITALS — BP 112/65 | HR 85 | Ht 67.0 in | Wt 234.9 lb

## 2016-09-07 DIAGNOSIS — N400 Enlarged prostate without lower urinary tract symptoms: Secondary | ICD-10-CM

## 2016-09-07 NOTE — Progress Notes (Signed)
09/07/2016 4:00 PM   Mario Ward Nov 26, 1945 [id number]  Referring provider: Sofie Hartigan, MD [place], [place] [address]  Chief Complaint  Patient presents with  . Follow-up    testicle shrinking    HPI: The patient is a 71 year old gentleman with a history of AAA status post stent, CABG 4, diabetes, sleep apnea on CPAP, CAD who presents today with a chief complaint of testicular shrinkage.  1. Testicular concerns Patient presents with complaint of shrinking testicles. He read some more online the Flomax can cause this. He is concerned that this is why he is noticing this. No pain or masses.  2. BPH Currently symptoms well controlled on Flomax.  3. Erectile dysfunction The patient presents with concerns with his inflatable penile prosthesis. He was placed 24 years ago. He is no longer using it is not sexually active for at least 3 years or longer. He is not interested in obtaining erections. His concern now though is that approximate 3-4 times per week his penile prosthesis will randomly inflate and he will need to deflate it manually. He finds is socially awkward. However, he does not find this bothersome enough to undergo surgery. He denies any sign of infection or erosion.   PMH: Past Medical History:  Diagnosis Date  . AAA (abdominal aortic aneurysm) ([place])   . Anginal pain ([place])   . Arthritis   . COPD (chronic obstructive pulmonary disease) ([place])   . Coronary artery disease 1997   CABG x 4   . Diabetes mellitus without complication [facility])    Patient states he had Gastric Bypass and is no longer on meds.  . 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 GERD (gastroesophageal reflux disease)   . Hypertension   . Sleep apnea    uses a CPAP at night.   . Vitamin D deficiency     Surgical History: Past Surgical History:  Procedure Laterality Date  . BARIATRIC SURGERY    . CARDIAC CATHETERIZATION  05/03/2013   [facility] in [place]  . CHOLECYSTECTOMY    .  CORONARY ARTERY BYPASS GRAFT  1997   CABG x 4 in [place]   . EYE SURGERY Bilateral    Cataract Extraction with IOL  . JOINT REPLACEMENT Right    total knee replacement  . PENILE PROSTHESIS IMPLANT    . PERIPHERAL VASCULAR CATHETERIZATION N/A 04/02/2015   Procedure: Endovascular Repair/Stent Graft;  Surgeon: Katha Cabal, MD;  Location: [place] [facility];  Service: Cardiovascular;  Laterality: N/A;  . REPLACEMENT TOTAL KNEE     right    Home Medications:  Allergies as of 09/07/2016   No Known Allergies     Medication List       Accurate as of 09/07/16  4:00 PM. Always use your most recent med list.          albuterol (2.5 MG/3ML) 0.083% nebulizer solution Commonly known as:  PROVENTIL Take 2.5 mg by nebulization every 2 (two) hours as needed for wheezing or shortness of breath.   aspirin EC 81 MG tablet Take 81 mg by mouth daily.   cholecalciferol 1000 units tablet Commonly known as:  VITAMIN D Take 2,000 Units by mouth daily.   clopidogrel 75 MG tablet Commonly known as:  PLAVIX Take 75 mg by mouth daily.   COMBIVENT RESPIMAT 20-100 MCG/ACT Aers respimat Generic drug:  Ipratropium-Albuterol Inhale 1 puff into the lungs every 6 (six) hours.   ezetimibe 10 MG tablet Commonly known as:  ZETIA Take 1 tablet (10 mg total) by mouth daily.   fluticasone 50 MCG/ACT nasal spray Commonly known as:  FLONASE Place into the nose.   lisinopril 40 MG tablet Commonly known as:  PRINIVIL,ZESTRIL Take 40 mg by mouth daily.   loratadine 10 MG tablet Commonly known as:  CLARITIN Take 10 mg by mouth daily.   metoprolol tartrate 25 MG tablet Commonly known as:  LOPRESSOR Take 25 mg by mouth 2 (two) times daily.   multivitamin tablet Take 1 tablet by mouth daily.   nitroGLYCERIN 0.4 MG SL tablet Commonly known as:  NITROSTAT Place 0.4 mg under the tongue every 5 (five) minutes as needed for chest pain.   PATADAY 0.2 % Soln Generic drug:  Olopatadine HCl Apply 2  drops to eye as needed.   RABEprazole 20 MG tablet Commonly known as:  ACIPHEX Take 20 mg by mouth daily.   RABEprazole 20 MG tablet Commonly known as:  ACIPHEX TAKE 1 TABLET DAILY 45 MINUTES BEFORE BREAKFAST ON AN EMPTY STOMACH   simvastatin 40 MG tablet Commonly known as:  ZOCOR TAKE 1 TABLET DAILY   tamsulosin 0.4 MG Caps capsule Commonly known as:  FLOMAX Take 1 capsule (0.4 mg total) by mouth daily.   vitamin B-12 1000 MCG tablet Commonly known as:  CYANOCOBALAMIN Take 1,000 mcg by mouth daily.       Allergies: No Known Allergies  Family History: Family History  Problem Relation Age of Onset  . Heart disease Mother   . Heart attack Mother 83  . Heart disease Father   . Heart attack Father 74  . Heart disease Brother   . Heart attack Brother 52    Social History:  reports that he quit smoking about 10 years ago. His smoking use included Cigarettes. He has a 40.00 pack-year smoking history. He has never used smokeless tobacco. He reports that he does not drink alcohol or use drugs.  ROS: UROLOGY Frequent Urination?: No Hard to postpone urination?: No Burning/pain with urination?: No Get up at night to urinate?: No Leakage of urine?: No Urine stream starts and stops?: No Trouble starting stream?: No Do you have to strain to urinate?: No Blood in urine?: No Urinary tract infection?: No Sexually transmitted disease?: No Injury to kidneys or bladder?: No Painful intercourse?: No Weak stream?: No Erection problems?: No Penile pain?: No  Gastrointestinal Nausea?: No Vomiting?: No Indigestion/heartburn?: No Diarrhea?: No Constipation?: No  Constitutional Fever: No Night sweats?: No Weight loss?: No Fatigue?: No  Skin Skin rash/lesions?: No Itching?: No  Eyes Blurred vision?: No Double vision?: No  Ears/Nose/Throat Sore throat?: No Sinus problems?: No  Hematologic/Lymphatic Swollen glands?: No Easy bruising?: No  Cardiovascular Leg  swelling?: No Chest pain?: No  Respiratory Cough?: No Shortness of breath?: No  Endocrine Excessive thirst?: No  Musculoskeletal Back pain?: No Joint pain?: No  Neurological Headaches?: No Dizziness?: No  Psychologic Depression?: No Anxiety?: No  Physical Exam: BP 112/65 (BP Location: Left Arm, Patient Position: Sitting, Cuff Size: Normal)   Pulse 85   Ht 5\' 7"  (1.702 m)   Wt 234 lb 14.4 oz (106.5 kg)   BMI 36.79 kg/m   Constitutional:  Alert and oriented, No acute distress. HEENT: [place] AT, moist mucus membranes.  Trachea midline, no masses. Cardiovascular: No clubbing, cyanosis, or edema. Respiratory: Normal respiratory effort, no increased work of breathing. GI: Abdomen is soft, nontender, nondistended, no abdominal masses GU: No CVA tenderness. Normal phallus. Prosthesis palpable. Normal testicles bilaterally. Appropriate  exam for age. Skin: No rashes, bruises or suspicious lesions. Lymph: No cervical or inguinal adenopathy. Neurologic: Grossly intact, no focal deficits, moving all 4 extremities. Psychiatric: Normal mood and affect.  Laboratory Data: Lab Results  Component Value Date   WBC 9.2 04/03/2015   HGB 12.6 (L) 04/03/2015   HCT 36.7 (L) 04/03/2015   MCV 89.1 04/03/2015   PLT 197 04/03/2015    Lab Results  Component Value Date   CREATININE 0.80 05/13/2016    No results found for: PSA  No results found for: TESTOSTERONE  No results found for: HGBA1C  Urinalysis No results found for: COLORURINE, APPEARANCEUR, LABSPEC, PHURINE, GLUCOSEU, HGBUR, BILIRUBINUR, KETONESUR, PROTEINUR, UROBILINOGEN, NITRITE, LEUKOCYTESUR   Assessment & Plan:    1. Testicular exam I reassured the patient that his testicles are completely normal for his age. There is no sign malignancy. No further workup is needed.  2. BPH -continue flomax  3. Auto inflation of penile prosthesis Remains uninterested in removal or placement of this device as he is not currently  bothered by it.  Return for as previously scheduled.  Nickie Retort, MD  [facility] [address], [place], [place] [address] [phone]

## 2016-09-19 NOTE — Progress Notes (Signed)
Cardiology Office Note  Date:  09/20/2016   ID:  Mario Ward, DOB 04-11-1945, MRN [id number]  PCP:  Sofie Hartigan, MD   Chief Complaint  Patient presents with  . other    6 month follow up. Meds reviewed by the pt. verbally. "doing well."     HPI:  Mario Ward is a 71 year old gentleman with a history of  coronary artery disease, bypass x4 on 02/08/1995,  diabetes,  smoking for 40 years stopped 7 years ago,  obesity,  obstructive sleep apnea who is compliant with his CPAP,  hyperlipidemia  Last cardiac catheterization April 2015 4.8 cm AAA, had a stent placed by Dr. Ronalee Belts diagnosis of well-differentiated neuroendocrine tumor, carcinoid tumor, followed by [facility] doctor Jowell who presents for routine follow up of his coronary artery disease/CABG and PAD  In follow-up today he reports having aching all over his body, knee pain, joint pain He strongly feels it is from the medications Total chol 143, ldl 81 He does not feel at the [place] made much of a difference Wonders if he should come off some of his cholesterol medication Has not tried crestor and lipitor  Also word he is having side effects from the Flomax but denies having dizziness  Denies any significant shortness of breath on exertion, no chest pain concerning for angina  no change in exercise tolerance Weight continues to trend upwards  He has periodic follow-up with Dr. Raul Del for pulmonary issues Uses inhaler PRN, not on a regular basis  Reports he is scheduled to see Dr. Francella Solian at [facility],  annual EGD Last Procedure 04/2016  EKG on today's visit shows normal sinus rhythm with rate 75 bpm with no significant ST or T-wave changes  Other past medical history reviewed On 06/24/2014 he Had bariatric surgery, performed by dr. Darnell Level Still on cpap because he sleeps better with the machine   total cholesterol February 2017 was 169, LDL 96 Follow-up LDL of 80  AAA stent placed 04/02/2015  upper endoscopy  at [facility], diagnosis of well-differentiated neuroendocrine tumor, carcinoid tumor  prior cardiac catheterization in April 2015.  05/02/2013, He developed chest discomfort, malaise, did not feel well. His wife called 93 and he was initially taken to a hospital in [place], and transferred to [facility] where he underwent cardiac catheterization  Cardiac catheterization showed severe three-vessel coronary artery disease. He had an occluded RCA, occluded mid LAD, occluded marginal branch off the circumflex,  He had a vein graft to the diagonal, vein graft to the OM, occluded vein graft to the RCA, patent LIMA to the LAD. Medical management was recommended Ejection fraction estimated at 55%, normal wall motion.   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  PSH:    Past Surgical History:  Procedure Laterality Date  . BARIATRIC SURGERY    . CARDIAC CATHETERIZATION  05/03/2013   [facility] in [place]  . CHOLECYSTECTOMY    . CORONARY ARTERY BYPASS GRAFT  1997   CABG x 4 in [place]   . EYE SURGERY Bilateral    Cataract Extraction with IOL  . JOINT REPLACEMENT Right    total knee replacement  . PENILE PROSTHESIS IMPLANT    .  PERIPHERAL VASCULAR CATHETERIZATION N/A 04/02/2015   Procedure: Endovascular Repair/Stent Graft;  Surgeon: Katha Cabal, MD;  Location: [facility];  Service: Cardiovascular;  Laterality: N/A;  . REPLACEMENT TOTAL KNEE     right    Current Outpatient Prescriptions  Medication Sig Dispense Refill  . albuterol (PROVENTIL) (2.5 MG/3ML) 0.083% nebulizer  solution Take 2.5 mg by nebulization every 2 (two) hours as needed for wheezing or shortness of breath.    [place] Kitchen aspirin EC 81 MG tablet Take 81 mg by mouth daily.    [place] Kitchen azelastine (ASTELIN) 0.1 % nasal spray Place 1 spray into both nostrils 2 (two) times daily. Use in each nostril as directed    . cholecalciferol (VITAMIN D) 1000 UNITS tablet Take 2,000 Units by mouth daily.    . clopidogrel (PLAVIX) 75 MG tablet Take 75 mg by mouth daily.    Mar[place] Kitchen ezetimibe (ZETIA) 10 MG tablet Take 1 tablet (10 mg total) by mouth daily. 90 tablet 3  . fluticasone (FLONASE) 50 MCG/ACT nasal spray Place into the nose.    [place] Kitchen Ipratropium-Albuterol (COMBIVENT RESPIMAT) 20-100 MCG/ACT AERS respimat Inhale 1 puff into the lungs every 6 (six) hours.    [place] Kitchen lisinopril (PRINIVIL,ZESTRIL) 40 MG tablet Take 40 mg by mouth daily.    Mar[place] Kitchen loratadine (CLARITIN) 10 MG tablet Take 10 mg by mouth daily.    . metoprolol tartrate (LOPRESSOR) 25 MG tablet Take 25 mg by mouth 2 (two) times daily.     . Multiple Vitamin (MULTIVITAMIN) tablet Take 1 tablet by mouth daily.    . nitroGLYCERIN (NITROSTAT) 0.4 MG SL tablet Place 0.4 mg under the tongue every 5 (five) minutes as needed for chest pain.    Mar[place] Kitchen Olopatadine HCl (PATADAY) 0.2 % SOLN Apply 2 drops to eye as needed.     . RABEprazole (ACIPHEX) 20 MG tablet TAKE 1 TABLET DAILY 45 MINUTES BEFORE BREAKFAST ON AN EMPTY STOMACH    . simvastatin (ZOCOR) 40 MG tablet TAKE 1 TABLET DAILY 90 tablet 3  . tamsulosin (FLOMAX) 0.4 MG CAPS capsule Take 1 capsule (0.4 mg total) by mouth daily. 90 capsule 3  . vitamin B-12 (CYANOCOBALAMIN) 1000 MCG tablet Take 1,000 mcg by mouth daily.     No current facility-administered medications for this visit.      Allergies:   Patient has no known allergies.   Social History:  The patient  reports that he quit smoking about 10 years ago. His smoking use included Cigarettes. He has a 40.00 pack-year smoking history. He has never used smokeless tobacco. He reports  that he does not drink alcohol or use drugs.   Family History:   family history includes Heart attack (age of onset: 6) in his brother; Heart attack (age of onset: 38) in his father; Heart attack (age of onset: 33) in his mother; Heart disease in his brother, father, and mother.    Review of Systems: Review of Systems  Constitutional: Negative.   Respiratory: Negative.   Cardiovascular: Negative.   Gastrointestinal: Negative.   Musculoskeletal: Negative.   Neurological: Negative.   Psychiatric/Behavioral: Negative.   All other systems reviewed and are negative.    PHYSICAL EXAM: VS:  BP 132/68 (BP Location: Left Arm, Patient Position: Sitting, Cuff Size: Normal)   Pulse 73   Ht 5\' 7"  (1.702 m)   Wt 235 lb 8 oz (106.8 kg)   BMI 36.88 kg/m  , BMI Body mass index is 36.88 kg/m. GEN: Well nourished, well developed, in  no acute distress , obese HEENT: normal  Neck: no JVD, carotid bruits, or masses Cardiac: RRR; no murmurs, rubs, or gallops,no edema  Respiratory:  clear to auscultation bilaterally, normal work of breathing GI: soft, nontender, nondistended, + BS MS: no deformity or atrophy  Skin: warm and dry, no rash Neuro:  Strength and sensation are intact Psych: euthymic mood, full affect    Recent Labs: 05/13/2016: Creatinine, Ser 0.80    Lipid Panel No results found for: CHOL, HDL, LDLCALC, TRIG    Wt Readings from Last 3 Encounters:  09/20/16 235 lb 8 oz (106.8 kg)  09/07/16 234 lb 14.4 oz (106.5 kg)  07/22/16 232 lb (105.2 kg)       ASSESSMENT AND PLAN:   Essential hypertension - Plan: EKG 12-Lead Blood pressure is well controlled on today's visit. No changes made to the medications.  Hyperlipidemia, unspecified hyperlipidemia type - Plan: EKG 12-Lead He feels he is having side effects from Zocor or Zetia or both Recommended he take a holiday from the medications and see if he feels better Potentially could change to Crestor Goal LDL less than  70  Hx of CABG He is aware of increased risk of bleeding by taking Plavix but does not want to change his medications at this time as he is doing well  OSA on CPAP Tolerating his CPAP we have recommended weight loss  Chronic hypoxemic respiratory failure ([place])  rarely uses inhalers , managed by pulmonary, Dr. Raul Del   Morbid obesity [facility])  recommended he start regular exercise program   Total encounter time more than 25 minutes  Greater than 50% was spent in counseling and coordination of care with the patient   Disposition:   F/U  6 months   No orders of the defined types were placed in this encounter.    Signed, Esmond Plants, M.D., Ph.D. 09/20/2016  [place], [place]

## 2016-09-20 ENCOUNTER — Ambulatory Visit: Payer: Medicare Other | Admitting: Cardiovascular Disease

## 2016-09-20 ENCOUNTER — Ambulatory Visit (INDEPENDENT_AMBULATORY_CARE_PROVIDER_SITE_OTHER): Payer: Medicare Other | Admitting: Cardiovascular Disease

## 2016-09-20 ENCOUNTER — Encounter: Payer: Self-pay | Admitting: Cardiovascular Disease

## 2016-09-20 VITALS — BP 132/68 | HR 73 | Ht 67.0 in | Wt 235.5 lb

## 2016-09-20 DIAGNOSIS — J432 Centrilobular emphysema: Secondary | ICD-10-CM | POA: Diagnosis not present

## 2016-09-20 DIAGNOSIS — I25118 Atherosclerotic heart disease of native coronary artery with other forms of angina pectoris: Secondary | ICD-10-CM | POA: Diagnosis not present

## 2016-09-20 DIAGNOSIS — R0602 Shortness of breath: Secondary | ICD-10-CM

## 2016-09-20 DIAGNOSIS — E118 Type 2 diabetes mellitus with unspecified complications: Secondary | ICD-10-CM | POA: Diagnosis not present

## 2016-09-20 DIAGNOSIS — I209 Angina pectoris, unspecified: Secondary | ICD-10-CM | POA: Diagnosis not present

## 2016-09-20 DIAGNOSIS — I714 Abdominal aortic aneurysm, without rupture, unspecified: Secondary | ICD-10-CM

## 2016-09-20 DIAGNOSIS — E782 Mixed hyperlipidemia: Secondary | ICD-10-CM | POA: Diagnosis not present

## 2016-09-20 DIAGNOSIS — Z9989 Dependence on other enabling machines and devices: Secondary | ICD-10-CM | POA: Diagnosis not present

## 2016-09-20 DIAGNOSIS — I10 Essential (primary) hypertension: Secondary | ICD-10-CM | POA: Diagnosis not present

## 2016-09-20 DIAGNOSIS — Z951 Presence of aortocoronary bypass graft: Secondary | ICD-10-CM | POA: Diagnosis not present

## 2016-09-20 DIAGNOSIS — G4733 Obstructive sleep apnea (adult) (pediatric): Secondary | ICD-10-CM | POA: Diagnosis not present

## 2016-09-20 NOTE — Patient Instructions (Addendum)
Medication Instructions:  Ok to stop zetia to see if aches get better  [facility] Stop the zocor if needed Please call if joint ache goes away    Labwork:  No new labs needed  Testing/Procedures:  No further testing at this time   Follow-Up: It was a pleasure seeing you in the office today. Please call us if you have new issues that need to be addressed before your next appt.  [phone]  Your physician wants you to follow-up in: 6 months.  You will receive a reminder letter in the mail two months in advance. If you don't receive a letter, please call our office to schedule the follow-up appointment.  If you need a refill on your cardiac medications before your next appointment, please call your pharmacy.

## 2016-09-28 ENCOUNTER — Encounter: Payer: Self-pay | Admitting: Cardiovascular Disease

## 2016-10-06 ENCOUNTER — Encounter: Payer: Self-pay | Admitting: Urology

## 2016-10-06 ENCOUNTER — Ambulatory Visit (INDEPENDENT_AMBULATORY_CARE_PROVIDER_SITE_OTHER): Payer: Medicare Other | Admitting: Urology

## 2016-10-06 VITALS — BP 122/69 | HR 79 | Ht 67.0 in | Wt 236.5 lb

## 2016-10-06 DIAGNOSIS — I25118 Atherosclerotic heart disease of native coronary artery with other forms of angina pectoris: Secondary | ICD-10-CM

## 2016-10-06 DIAGNOSIS — N400 Enlarged prostate without lower urinary tract symptoms: Secondary | ICD-10-CM | POA: Diagnosis not present

## 2016-10-06 DIAGNOSIS — E291 Testicular hypofunction: Secondary | ICD-10-CM

## 2016-10-06 NOTE — Progress Notes (Signed)
10/06/2016 4:01 PM   Mario Ward 11-18-1945 [id number]  Referring provider: Sofie Hartigan, MD [place], [place] [address]  Chief Complaint  Patient presents with  . Follow-up    Low T    HPI: The patient is a 71 year old gentleman with a history of AAA status post stent, CABG 4, diabetes, sleep apnea on CPAP, CAD who presents today with a chief complaint of low testosterone.    1. Low testosterone Recent testosterone was 88. Patient is no longer sexually active. Does have a penile prosthesis but does not use it.  Patient was recently seen with chief complaint of shrinking testicles. He read some more online the Flomax can cause this. He is concerned that this is why he is noticing this. No pain or masses. His testicles were normal for his age on exam.  2. BPH Currently symptoms well controlled on Flomax.  3. Erectile dysfunction The patient presents with concerns with his inflatable penile prosthesis. He was placed 24 years ago. He is no longer using it is not sexually active for at least 3 years or longer. He is not interested in obtaining erections. His concern now though is that approximate 3-4 times per week his penile prosthesis will randomly inflate and he will need to deflate it manually. He finds is socially awkward. However, he does not find thisbothersome enough to undergo surgery. He denies any sign of infection or erosion.      PMH: Past Medical History:  Diagnosis Date  . AAA (abdominal aortic aneurysm) ([place])   . Anginal pain ([place])   . Arthritis   . COPD (chronic obstructive pulmonary disease) ([place])   . Coronary artery disease 1997   CABG x 4   . Diabetes mellitus without complication [facility])    Patient states he had Gastric Bypass and is no longer on meds.  . 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 GERD (gastroesophageal reflux disease)   . Hypertension   . Sleep apnea    uses a CPAP at night.   . Vitamin D deficiency     Surgical  History: Past Surgical History:  Procedure Laterality Date  . BARIATRIC SURGERY    . CARDIAC CATHETERIZATION  05/03/2013   [facility] in [place]  . CHOLECYSTECTOMY    . CORONARY ARTERY BYPASS GRAFT  1997   CABG x 4 in [place]   . EYE SURGERY Bilateral    Cataract Extraction with IOL  . JOINT REPLACEMENT Right    total knee replacement  . PENILE PROSTHESIS IMPLANT    . PERIPHERAL VASCULAR CATHETERIZATION N/A 04/02/2015   Procedure: Endovascular Repair/Stent Graft;  Surgeon: Katha Cabal, MD;  Location: [facility];  Service: Cardiovascular;  Laterality: N/A;  . REPLACEMENT TOTAL KNEE     right    Home Medications:  Allergies as of 10/06/2016   No Known Allergies     Medication List       Accurate as of 10/06/16  4:01 PM. Always use your most recent med list.          albuterol (2.5 MG/3ML) 0.083% nebulizer solution Commonly known as:  PROVENTIL Take 2.5 mg by nebulization every 2 (two) hours as needed for wheezing or shortness of breath.   aspirin EC 81 MG tablet Take 81 mg by mouth daily.   azelastine 0.1 % nasal spray Commonly known as:  ASTELIN Place 1 spray into both nostrils 2 (two) times daily. Use in each nostril as directed  cholecalciferol 1000 units tablet Commonly known as:  VITAMIN D Take 2,000 Units by mouth daily.   clopidogrel 75 MG tablet Commonly known as:  PLAVIX Take 75 mg by mouth daily.   COMBIVENT RESPIMAT 20-100 MCG/ACT Aers respimat Generic drug:  Ipratropium-Albuterol Inhale 1 puff into the lungs every 6 (six) hours.   ezetimibe 10 MG tablet Commonly known as:  ZETIA Take 1 tablet (10 mg total) by mouth daily.   fluticasone 50 MCG/ACT nasal spray Commonly known as:  FLONASE Place into the nose.   lisinopril 40 MG tablet Commonly known as:  PRINIVIL,ZESTRIL Take 40 mg by mouth daily.   loratadine 10 MG tablet Commonly known as:  CLARITIN Take 10 mg by mouth daily.   metoprolol tartrate 25 MG tablet Commonly known  as:  LOPRESSOR Take 25 mg by mouth 2 (two) times daily.   multivitamin tablet Take 1 tablet by mouth daily.   nitroGLYCERIN 0.4 MG SL tablet Commonly known as:  NITROSTAT Place 0.4 mg under the tongue every 5 (five) minutes as needed for chest pain.   PATADAY 0.2 % Soln Generic drug:  Olopatadine HCl Apply 2 drops to eye as needed.   RABEprazole 20 MG tablet Commonly known as:  ACIPHEX TAKE 1 TABLET DAILY 45 MINUTES BEFORE BREAKFAST ON AN EMPTY STOMACH   simvastatin 40 MG tablet Commonly known as:  ZOCOR TAKE 1 TABLET DAILY   tamsulosin 0.4 MG Caps capsule Commonly known as:  FLOMAX Take 1 capsule (0.4 mg total) by mouth daily.   vitamin B-12 1000 MCG tablet Commonly known as:  CYANOCOBALAMIN Take 1,000 mcg by mouth daily.       Allergies: No Known Allergies  Family History: Family History  Problem Relation Age of Onset  . Heart disease Mother   . Heart attack Mother 81  . Heart disease Father   . Heart attack Father 21  . Heart disease Brother   . Heart attack Brother 59    Social History:  reports that he quit smoking about 10 years ago. His smoking use included Cigarettes. He has a 40.00 pack-year smoking history. He has never used smokeless tobacco. He reports that he does not drink alcohol or use drugs.  ROS: UROLOGY Frequent Urination?: No Hard to postpone urination?: No Burning/pain with urination?: No Get up at night to urinate?: No Leakage of urine?: No Urine stream starts and stops?: No Trouble starting stream?: No Do you have to strain to urinate?: No Blood in urine?: No Urinary tract infection?: No Sexually transmitted disease?: No Injury to kidneys or bladder?: No Painful intercourse?: No Weak stream?: No Erection problems?: No Penile pain?: No  Gastrointestinal Nausea?: No Vomiting?: No Indigestion/heartburn?: No Diarrhea?: No Constipation?: No  Constitutional Fever: No Night sweats?: No Weight loss?: No Fatigue?:  No  Skin Skin rash/lesions?: No Itching?: No  Eyes Blurred vision?: No Double vision?: No  Ears/Nose/Throat Sore throat?: No Sinus problems?: No  Hematologic/Lymphatic Swollen glands?: No Easy bruising?: No  Cardiovascular Leg swelling?: No Chest pain?: No  Respiratory Cough?: No Shortness of breath?: No  Endocrine Excessive thirst?: No  Musculoskeletal Back pain?: No Joint pain?: No  Neurological Headaches?: No Dizziness?: No  Psychologic Depression?: No Anxiety?: No  Physical Exam: BP 122/69 (BP Location: Left Arm, Patient Position: Sitting, Cuff Size: Normal)   Pulse 79   Ht 5\' 7"  (1.702 m)   Wt 236 lb 8 oz (107.3 kg)   BMI 37.04 kg/m   Constitutional:  Alert and oriented, No acute  distress. HEENT: [place] AT, moist mucus membranes.  Trachea midline, no masses. Cardiovascular: No clubbing, cyanosis, or edema. Respiratory: Normal respiratory effort, no increased work of breathing. GI: Abdomen is soft, nontender, nondistended, no abdominal masses GU: No CVA tenderness.  Skin: No rashes, bruises or suspicious lesions. Lymph: No cervical or inguinal adenopathy. Neurologic: Grossly intact, no focal deficits, moving all 4 extremities. Psychiatric: Normal mood and affect.  Laboratory Data: Lab Results  Component Value Date   WBC 9.2 04/03/2015   HGB 12.6 (L) 04/03/2015   HCT 36.7 (L) 04/03/2015   MCV 89.1 04/03/2015   PLT 197 04/03/2015    Lab Results  Component Value Date   CREATININE 0.80 05/13/2016    No results found for: PSA  No results found for: TESTOSTERONE  No results found for: HGBA1C  Urinalysis No results found for: COLORURINE, APPEARANCEUR, LABSPEC, PHURINE, GLUCOSEU, HGBUR, BILIRUBINUR, KETONESUR, PROTEINUR, UROBILINOGEN, NITRITE, LEUKOCYTESUR   Assessment & Plan:    1. Hypogonadism I discussed with the patient that he is not a candidate for testosterone replacement due to his severe cardiovascular disease which includes  history of CABG/stents, abdominal aortic aneurysm, and sleep apnea. We discussed testosterone replacement therapy has the potential to worsen this cardiovascular disease, his sleep apnea, and cause strokes. He is already at high risk for these problems,  So I do not think it would be safe to institute testosterone replacement due to the potential significant side effects. We discussed a low testosterone in and of itself is not a disease, and there is no medical risk to having a testosterone level of 88. All patient's questions were answered.  2. BPH -continue flomax  3. Auto inflation of penile prosthesis Remains uninterested in removal or replacement of this device as he is not currently bothered by it.  Return for as previously scheduled.  Nickie Retort, MD  [facility] [address] [place], [place] [address] [phone]

## 2016-10-15 ENCOUNTER — Ambulatory Visit: Payer: Medicare Other | Admitting: Podiatry

## 2016-10-26 ENCOUNTER — Ambulatory Visit (INDEPENDENT_AMBULATORY_CARE_PROVIDER_SITE_OTHER): Payer: Medicare Other | Admitting: Podiatry

## 2016-10-26 ENCOUNTER — Encounter: Payer: Self-pay | Admitting: Podiatry

## 2016-10-26 ENCOUNTER — Ambulatory Visit (INDEPENDENT_AMBULATORY_CARE_PROVIDER_SITE_OTHER): Payer: Medicare Other

## 2016-10-26 DIAGNOSIS — S92505A Nondisplaced unspecified fracture of left lesser toe(s), initial encounter for closed fracture: Secondary | ICD-10-CM

## 2016-10-26 DIAGNOSIS — M79672 Pain in left foot: Principal | ICD-10-CM

## 2016-10-26 DIAGNOSIS — M79671 Pain in right foot: Secondary | ICD-10-CM

## 2016-10-26 MED ORDER — NONFORMULARY OR COMPOUNDED ITEM: 2 refills | Status: DC

## 2016-10-26 NOTE — Progress Notes (Signed)
   Subjective:    Patient ID: Mario Ward, male    DOB: 07-15-1945, 71 y.o.   MRN: [id number]  HPI    Review of Systems  All other systems reviewed and are negative.      Objective:   Physical Exam        Assessment & Plan:

## 2016-10-27 NOTE — Progress Notes (Signed)
   HPI: 71 year old male with PMHx of T2DM presenting today as a new patient with a complaint of diffuse aching of bilateral feet that has been ongoing for several weeks. He also reports pain to the left fifth toe secondary to hitting it on something one week ago. There are no modifying factors noted. He is here for further evaluation and treatment.    Past Medical History:  Diagnosis Date  . AAA (abdominal aortic aneurysm) ([place])   . Anginal pain ([place])   . Arthritis   . COPD (chronic obstructive pulmonary disease) ([place])   . Coronary artery disease 1997   CABG x 4   . Diabetes mellitus without complication [facility])    Patient states he had Gastric Bypass and is no longer on meds.  . 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 GERD (gastroesophageal reflux disease)   . Hypertension   . Sleep apnea    uses a CPAP at night.   . Vitamin D deficiency      Physical Exam: General: The patient is alert and oriented x3 in no acute distress.  Dermatology: Skin is warm, dry and supple bilateral lower extremities. Negative for open lesions or macerations.  Vascular: Palpable pedal pulses bilaterally. No edema or erythema noted. Capillary refill within normal limits.  Neurological: Epicritic and protective threshold grossly intact bilaterally.   Musculoskeletal Exam: Range of motion within normal limits to all pedal and ankle joints bilateral. Muscle strength 5/5 in all groups bilateral.   Radiographic Exam:  Closed, nondisplaced fracture of the proximal phalanx of the left fifth toe.    Assessment: - Proximal phalanx fracture of the left fifth toe - General foot fatigue bilaterally   Plan of Care:  - Patient evaluated. X-rays reviewed. - Compression anklet dispensed bilaterally. - Prescription for pain cream to be dispensed from [facility]. - Recommended OTC insoles from [employer]. - Discussed conservative treatment of toe fracture. - Return to clinic when necessary.   Edrick Kins,  DPM [facility]  Dr. Edrick Kins, DPM    [address], [place] [address]                Office [phone]  Fax [phone]

## 2017-01-08 ENCOUNTER — Emergency Department
Admission: EM | Admit: 2017-01-08 | Discharge: 2017-01-08 | Disposition: A | Discharge status: Home / Self Care | Payer: Medicare Other | Attending: Emergency Medicine | Admitting: Emergency Medicine

## 2017-01-08 DIAGNOSIS — E119 Type 2 diabetes mellitus without complications: Secondary | ICD-10-CM | POA: Diagnosis not present

## 2017-01-08 DIAGNOSIS — Z96651 Presence of right artificial knee joint: Secondary | ICD-10-CM | POA: Insufficient documentation

## 2017-01-08 DIAGNOSIS — I2510 Atherosclerotic heart disease of native coronary artery without angina pectoris: Secondary | ICD-10-CM | POA: Diagnosis not present

## 2017-01-08 DIAGNOSIS — Z7982 Long term (current) use of aspirin: Secondary | ICD-10-CM | POA: Insufficient documentation

## 2017-01-08 DIAGNOSIS — I10 Essential (primary) hypertension: Secondary | ICD-10-CM | POA: Insufficient documentation

## 2017-01-08 DIAGNOSIS — J449 Chronic obstructive pulmonary disease, unspecified: Secondary | ICD-10-CM | POA: Insufficient documentation

## 2017-01-08 DIAGNOSIS — Z87891 Personal history of nicotine dependence: Secondary | ICD-10-CM | POA: Diagnosis not present

## 2017-01-08 DIAGNOSIS — I713 Abdominal aortic aneurysm, ruptured: Secondary | ICD-10-CM | POA: Insufficient documentation

## 2017-01-08 DIAGNOSIS — Z7901 Long term (current) use of anticoagulants: Secondary | ICD-10-CM | POA: Insufficient documentation

## 2017-01-08 DIAGNOSIS — R040 Epistaxis: Secondary | ICD-10-CM

## 2017-01-08 DIAGNOSIS — Z951 Presence of aortocoronary bypass graft: Secondary | ICD-10-CM | POA: Diagnosis not present

## 2017-01-08 MED ORDER — OXYMETAZOLINE HCL 0.05 % NA SOLN
NASAL | Status: AC
  Filled 2017-01-08: qty 15

## 2017-01-08 MED ORDER — TRANEXAMIC ACID 1000 MG/10ML IV SOLN
500.0000 mg | Freq: Once | INTRAVENOUS | Status: AC
  Administered 2017-01-08: 500 mg via TOPICAL
  Filled 2017-01-08: qty 10

## 2017-01-08 MED ORDER — OXYMETAZOLINE HCL 0.05 % NA SOLN
1.0000 | Freq: Once | NASAL | Status: AC
  Administered 2017-01-08: 04:00:00 via NASAL

## 2017-01-08 NOTE — ED Notes (Signed)
Pt states he was sleeping and felt nose bleeding and felt liquid in his throat. Pt states he was wearing his Cpap. First time this has happened. Wife at bedside.

## 2017-01-08 NOTE — Discharge Instructions (Signed)

## 2017-01-08 NOTE — ED Provider Notes (Signed)
[facility] Emergency Department Provider Note  ____________________________________________   First MD Initiated Contact with Patient 01/08/17 0354     (approximate)  I have reviewed the triage vital signs and the nursing notes.   HISTORY  Chief Complaint Epistaxis    HPI Mario Ward is a 71 y.o. male with medical history as listed below which includes Plavix and CPAP usage (but with humidifier) who presents for evaluation of acute onset right sided epistaxis.  He states he was asleep using the CPAP when he woke up feeling something dripping down the back of his throat which turned out to be blood.  He tried applying constant pressure for 20 minutes but it did not stop the bleeding.  The symptoms started acutely about 3 hours ago and he has been oozing blood since then.  Direct pressure helps but does not fix the problem.  He denies lightheadedness, shortness of breath, chest pain, nausea, vomiting, and abdominal pain.  He uses Afrin regularly before he goes to sleep to help breathing with the CPAP but he also uses the humidifier and has not had a history of recurrent nosebleeds.  Describes the symptoms as severe.  No trauma history.   Past Medical History:  Diagnosis Date  . AAA (abdominal aortic aneurysm) ([place])   . Anginal pain ([place])   . Arthritis   . COPD (chronic obstructive pulmonary disease) ([place])   . Coronary artery disease 1997   CABG x 4   . Diabetes mellitus without complication [facility])    Patient states he had Gastric Bypass and is no longer on meds.  . 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 GERD (gastroesophageal reflux disease)   . Hypertension   . Sleep apnea    uses a CPAP at night.   . Vitamin D deficiency     Patient Active Problem List   Diagnosis Date Noted  . CAD (coronary artery disease) 07/21/2016  . AAA (abdominal aortic aneurysm) ([place]) 04/02/2015  . OSA on CPAP 07/29/2014  . 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 Obstructive sleep apnea  10/04/2013  . Chronic hypoxemic respiratory failure ([place]) 09/29/2013  . Hx of CABG 09/06/2013  . SOB (shortness of breath) on exertion 09/06/2013  . 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 Diabetes mellitus type 2 with complications ([place]) 74/09/1446  . Hyperlipidemia 09/06/2013  . Essential hypertension 09/06/2013  . Morbid obesity ([place]) 09/06/2013  . Preop cardiovascular exam 09/06/2013  . Gastroesophageal reflux disease without esophagitis 09/06/2013  . COPD (chronic obstructive pulmonary disease) ([place]) 09/06/2013    Past Surgical History:  Procedure Laterality Date  . BARIATRIC SURGERY    . CARDIAC CATHETERIZATION  05/03/2013   [facility] in [place]  . CHOLECYSTECTOMY    . CORONARY ARTERY BYPASS GRAFT  1997   CABG x 4 in [place]   . EYE SURGERY Bilateral    Cataract Extraction with IOL  . JOINT REPLACEMENT Right    total knee replacement  . PENILE PROSTHESIS IMPLANT    . PERIPHERAL VASCULAR CATHETERIZATION N/A 04/02/2015   Procedure: Endovascular Repair/Stent Graft;  Surgeon: Katha Cabal, MD;  Location: [place] [facility];  Service: Cardiovascular;  Laterality: N/A;  . REPLACEMENT TOTAL KNEE     right    Prior to Admission medications   Medication Sig Start Date End Date Taking? Authorizing Provider  albuterol (PROVENTIL) (2.5 MG/3ML) 0.083% nebulizer solution Take 2.5 mg by nebulization every 2 (two) hours as needed for wheezing or shortness of breath.  [provider]  aspirin EC 81 MG tablet Take 81 mg by mouth daily.    [provider]  azelastine (ASTELIN) 0.1 % nasal spray Place 1 spray into both nostrils 2 (two) times daily. Use in each nostril as directed    [provider]  cholecalciferol (VITAMIN D) 1000 UNITS tablet Take 2,000 Units by mouth daily.    [provider]  clopidogrel (PLAVIX) 75 MG tablet Take 75 mg by mouth daily.    [provider]  ezetimibe (ZETIA) 10 MG tablet Take 1 tablet (10 mg total) by mouth daily.  03/29/16 09/20/16  Minna Merritts, MD  fluticasone (FLONASE) 50 MCG/ACT nasal spray Place into the nose.    [provider]  Ipratropium-Albuterol (COMBIVENT RESPIMAT) 20-100 MCG/ACT AERS respimat Inhale 1 puff into the lungs every 6 (six) hours.    [provider]  lisinopril (PRINIVIL,ZESTRIL) 40 MG tablet Take 40 mg by mouth daily.    [provider]  loratadine (CLARITIN) 10 MG tablet Take 10 mg by mouth daily.    [provider]  metoprolol tartrate (LOPRESSOR) 25 MG tablet Take 25 mg by mouth 2 (two) times daily.  09/02/14   [provider]  Multiple Vitamin (MULTIVITAMIN) tablet Take 1 tablet by mouth daily.    [provider]  nitroGLYCERIN (NITROSTAT) 0.4 MG SL tablet Place 0.4 mg under the tongue every 5 (five) minutes as needed for chest pain.    [provider]  NONFORMULARY OR COMPOUNDED ITEM See pharmacy note 10/26/16   Edrick Kins, DPM  Olopatadine HCl (PATADAY) 0.2 % SOLN Apply 2 drops to eye as needed.     [provider]  RABEprazole (ACIPHEX) 20 MG tablet TAKE 1 TABLET DAILY 45 MINUTES BEFORE BREAKFAST ON AN EMPTY STOMACH 04/14/16   [provider]  simvastatin (ZOCOR) 40 MG tablet TAKE 1 TABLET DAILY 06/21/16   Minna Merritts, MD  tamsulosin (FLOMAX) 0.4 MG CAPS capsule Take 1 capsule (0.4 mg total) by mouth daily. 03/18/16   Nickie Retort, MD  vitamin B-12 (CYANOCOBALAMIN) 1000 MCG tablet Take 1,000 mcg by mouth daily.    [provider]    Allergies Patient has no known allergies.  Family History  Problem Relation Age of Onset  . Heart disease Mother   . Heart attack Mother 40  . Heart disease Father   . Heart attack Father 27  . Heart disease Brother   . Heart attack Brother 42    Social History Social History   Tobacco Use  . Smoking status: Former Smoker    Packs/day: 1.00    Years: 40.00    Pack years: 40.00    Types: Cigarettes    Last attempt to quit:  09/07/2006    Years since quitting: 10.3  . Smokeless tobacco: Never Used  Substance Use Topics  . Alcohol use: No  . Drug use: No    Review of Systems Constitutional: No fever/chills Eyes: No visual changes. ENT: Acute right-sided nosebleed Cardiovascular: Denies chest pain. Respiratory: Denies shortness of breath. Gastrointestinal: No abdominal pain.  No nausea, no vomiting.  No diarrhea.  No constipation. Neurological: Negative for headaches, focal weakness or numbness.   ____________________________________________   PHYSICAL EXAM:  VITAL SIGNS: ED Triage Vitals  Enc Vitals Group     BP 01/08/17 0310 (!) 172/79     Pulse Rate 01/08/17 0335 82     Resp 01/08/17 0310 16     Temp --  Temp Source 01/08/17 0310 Oral     SpO2 01/08/17 0310 96 %     Weight 01/08/17 0310 104.3 kg (230 lb)     Height 01/08/17 0310 1.702 m (5\' 7" )     Head Circumference --      Peak Flow --      Pain Score --      Pain Loc --      Pain Edu? --      Excl. in [place]? --     Constitutional: Alert and oriented. Well appearing and in no acute distress. Eyes: Conjunctivae are normal.   Head: Atraumatic. Nose: After the patient blew his nose gently, I could see some irritation in the right naris but no obvious source of bleeding. Mouth/Throat: Mucous membranes are moist. Neck: No stridor.  No meningeal signs.   Respiratory: Normal respiratory effort.  No retractions.  Neurologic:  Normal speech and language. No gross focal neurologic deficits are appreciated.  Skin:  Skin is warm, dry and intact. No rash noted.   ____________________________________________   LABS (all labs ordered are listed, but only abnormal results are displayed)  Labs Reviewed - No data to display ____________________________________________  EKG  None - EKG not ordered by ED physician ____________________________________________  RADIOLOGY   No results  found.  ____________________________________________   PROCEDURES  Critical Care performed: No   Procedure(s) performed:   .Epistaxis Management Date/Time: 01/08/2017 4:18 AM Performed by: Hinda Kehr, MD Authorized by: Hinda Kehr, MD   Consent:    Consent obtained:  Verbal   Risks discussed:  Bleeding, infection, nasal injury and pain   Alternatives discussed:  No treatment Procedure details:    Treatment site:  R anterior   Repair method: 2x2 soaked in TXA after Afrin spray.   Treatment complexity:  Limited   Treatment episode: initial   Post-procedure details:    Assessment:  Bleeding stopped   Patient tolerance of procedure:  Tolerated well, no immediate complications      ____________________________________________   INITIAL IMPRESSION / ASSESSMENT AND PLAN / ED COURSE  As part of my medical decision making, I reviewed the following data within the [place] notes reviewed and incorporated    Nontraumatic right sided epistaxis on Plavix.  Fortunately after treating as described above in the procedure note the bleeding has stopped and I provided observation for about 20 minutes after I removed the TXA nasal packing and he did not have any additional rebleeding.  I offered to have the patient stay longer but he wants to go home.  I gave my usual customary management recommendations and return precautions and provided ENT follow-up information.  He understands and agrees with the plan.     ____________________________________________  FINAL CLINICAL IMPRESSION(S) / ED DIAGNOSES  Final diagnoses:  Right-sided epistaxis     MEDICATIONS GIVEN DURING THIS VISIT:  Medications  tranexamic acid (CYKLOKAPRON) injection 500 mg (500 mg Topical Given by Other 01/08/17 0413)  oxymetazoline (AFRIN) 0.05 % nasal spray 1 spray ( Each Nare Given by Other 01/08/17 7681)     ED Discharge Orders    None       Note:  This document was  prepared using Dragon voice recognition software and may include unintentional dictation errors.    Hinda Kehr, MD 01/08/17 [phone]

## 2017-01-08 NOTE — ED Triage Notes (Addendum)
Pt with right nare nosebleed that began at 0130. Pt with near constant bleeding, unrelieved with pressure. Skin pwd, resps unlabored.pt is on plavix.

## 2017-01-08 NOTE — ED Notes (Signed)
Pt is on plavix 

## 2017-03-01 ENCOUNTER — Encounter: Payer: Medicare Other | Attending: Specialist

## 2017-03-01 ENCOUNTER — Other Ambulatory Visit: Payer: Self-pay

## 2017-03-01 VITALS — Ht 67.0 in | Wt 247.4 lb

## 2017-03-01 DIAGNOSIS — J449 Chronic obstructive pulmonary disease, unspecified: Secondary | ICD-10-CM | POA: Insufficient documentation

## 2017-03-01 NOTE — Patient Instructions (Signed)
Patient Instructions  Patient Details  Name: Mario Ward MRN: [id number] Date of Birth: August 01, 1945 Referring Provider:  Erby Pian, MD  Below are your personal goals for exercise, nutrition, and risk factors. Our goal is to help you stay on track towards obtaining and maintaining these goals. We will be discussing your progress on these goals with you throughout the program.  Initial Exercise Prescription: Initial Exercise Prescription - 03/01/17 1300      Date of Initial Exercise RX and Referring Provider   Date  03/01/17    Referring Provider  Raul Del      Treadmill   MPH  2    Grade  0    Minutes  15    METs  2.5      NuStep   Level  2    SPM  80    Minutes  15    METs  2.2      REL-XR   Level  2    Speed  50    Minutes  15    METs  2.2      Biostep-RELP   Level  3    SPM  50    Minutes  15    METs  2      Prescription Details   Frequency (times per week)  3    Duration  Progress to 45 minutes of aerobic exercise without signs/symptoms of physical distress      Intensity   THRR 40-80% of Max Heartrate  98-130    Ratings of Perceived Exertion  11-13    Perceived Dyspnea  0-4      Resistance Training   Training Prescription  Yes    Weight  3 lb    Reps  10-15       Exercise Goals: Frequency: Be able to perform aerobic exercise two to three times per week in program working toward 2-5 days per week of home exercise.  Intensity: Work with a perceived exertion of 11 (fairly light) - 15 (hard) while following your exercise prescription.  We will make changes to your prescription with you as you progress through the program.   Duration: Be able to do 30 to 45 minutes of continuous aerobic exercise in addition to a 5 minute warm-up and a 5 minute cool-down routine.   Nutrition Goals: Your personal nutrition goals will be established when you do your nutrition analysis with the dietician.  The following are general nutrition guidelines to  follow: Cholesterol < 200mg /day Sodium < 1500mg /day Fiber: Men over 50 yrs - 30 grams per day  Personal Goals: Personal Goals and Risk Factors at Admission - 03/01/17 1143      Core Components/Risk Factors/Patient Goals on Admission    Weight Management  Yes;Weight Loss    Intervention  Weight Management: Develop a combined nutrition and exercise program designed to reach desired caloric intake, while maintaining appropriate intake of nutrient and fiber, sodium and fats, and appropriate energy expenditure required for the weight goal.;Weight Management: Provide education and appropriate resources to help participant work on and attain dietary goals.;Weight Management/Obesity: Establish reasonable short term and long term weight goals.;Obesity: Provide education and appropriate resources to help participant work on and attain dietary goals.    Admit Weight  247 lb (112 kg)    Goal Weight: Short Term  242 lb (109.8 kg)    Goal Weight: Long Term  200 lb (90.7 kg)    Expected Outcomes  Short Term: Continue to assess  and modify interventions until short term weight is achieved;Long Term: Adherence to nutrition and physical activity/exercise program aimed toward attainment of established weight goal;Weight Loss: Understanding of general recommendations for a balanced deficit meal plan, which promotes 1-2 lb weight loss per week and includes a negative energy balance of [phone] kcal/d;Understanding recommendations for meals to include 15-35% energy as protein, 25-35% energy from fat, 35-60% energy from carbohydrates, less than 200mg  of dietary cholesterol, 20-35 gm of total fiber daily;Understanding of distribution of calorie intake throughout the day with the consumption of 4-5 meals/snacks    Improve shortness of breath with ADL's  Yes    Intervention  Provide education, individualized exercise plan and daily activity instruction to help decrease symptoms of SOB with activities of daily living.     Expected Outcomes  Short Term: Improve cardiorespiratory fitness to achieve a reduction of symptoms when performing ADLs;Long Term: Be able to perform more ADLs without symptoms or delay the onset of symptoms    Diabetes  Yes    Intervention  Provide education about proper nutrition, including hydration, and aerobic/resistive exercise prescription along with prescribed medications to achieve blood glucose in normal ranges: Fasting glucose 65-99 mg/dL;Provide education about signs/symptoms and action to take for hypo/hyperglycemia.    Expected Outcomes  Short Term: Participant verbalizes understanding of the signs/symptoms and immediate care of hyper/hypoglycemia, proper foot care and importance of medication, aerobic/resistive exercise and nutrition plan for blood glucose control.;Long Term: Attainment of HbA1C < 7%.    Heart Failure  Yes    Intervention  Provide a combined exercise and nutrition program that is supplemented with education, support and counseling about heart failure. Directed toward relieving symptoms such as shortness of breath, decreased exercise tolerance, and extremity edema.    Expected Outcomes  Improve functional capacity of life;Short term: Attendance in program 2-3 days a week with increased exercise capacity. Reported lower sodium intake. Reported increased fruit and vegetable intake. Reports medication compliance.;Short term: Daily weights obtained and reported for increase. Utilizing diuretic protocols set by physician.;Long term: Adoption of self-care skills and reduction of barriers for early signs and symptoms recognition and intervention leading to self-care maintenance.    Hypertension  Yes    Intervention  Provide education on lifestyle modifcations including regular physical activity/exercise, weight management, moderate sodium restriction and increased consumption of fresh fruit, vegetables, and low fat dairy, alcohol moderation, and smoking cessation.;Monitor  prescription use compliance.    Expected Outcomes  Short Term: Continued assessment and intervention until BP is < 140/85mm HG in hypertensive participants. < 130/76mm HG in hypertensive participants with diabetes, heart failure or chronic kidney disease.;Long Term: Maintenance of blood pressure at goal levels.    Lipids  Yes medications prescribed becuase of Bypass    Intervention  Provide education and support for participant on nutrition & aerobic/resistive exercise along with prescribed medications to achieve LDL 70mg , HDL >40mg .    Expected Outcomes  Short Term: Participant states understanding of desired cholesterol values and is compliant with medications prescribed. Participant is following exercise prescription and nutrition guidelines.;Long Term: Cholesterol controlled with medications as prescribed, with individualized exercise RX and with personalized nutrition plan. Value goals: LDL < 70mg , HDL > 40 mg.       Tobacco Use Initial Evaluation: Social History   Tobacco Use  Smoking Status Former Smoker  . Packs/day: 1.00  . Years: 40.00  . Pack years: 40.00  . Types: Cigarettes  . Last attempt to quit: 09/07/2006  . Years since quitting: 10.4  Smokeless Tobacco Never Used    Exercise Goals and Review: Exercise Goals    Row Name 03/01/17 1259             Exercise Goals   Increase Physical Activity  Yes       Intervention  Provide advice, education, support and counseling about physical activity/exercise needs.;Develop an individualized exercise prescription for aerobic and resistive training based on initial evaluation findings, risk stratification, comorbidities and participant's personal goals.       Expected Outcomes  Short Term: Attend rehab on a regular basis to increase amount of physical activity.;Long Term: Add in home exercise to make exercise part of routine and to increase amount of physical activity.;Long Term: Exercising regularly at least 3-5 days a week.        Increase Strength and Stamina  Yes       Intervention  Provide advice, education, support and counseling about physical activity/exercise needs.;Develop an individualized exercise prescription for aerobic and resistive training based on initial evaluation findings, risk stratification, comorbidities and participant's personal goals.       Expected Outcomes  Short Term: Increase workloads from initial exercise prescription for resistance, speed, and METs.;Short Term: Perform resistance training exercises routinely during rehab and add in resistance training at home;Long Term: Improve cardiorespiratory fitness, muscular endurance and strength as measured by increased METs and functional capacity (6MWT)       Able to understand and use rate of perceived exertion (RPE) scale  Yes       Intervention  Provide education and explanation on how to use RPE scale       Expected Outcomes  Short Term: Able to use RPE daily in rehab to express subjective intensity level;Long Term:  Able to use RPE to guide intensity level when exercising independently       Able to understand and use Dyspnea scale  Yes       Intervention  Provide education and explanation on how to use Dyspnea scale       Expected Outcomes  Short Term: Able to use Dyspnea scale daily in rehab to express subjective sense of shortness of breath during exertion;Long Term: Able to use Dyspnea scale to guide intensity level when exercising independently       Knowledge and understanding of Target Heart Rate Range (THRR)  Yes       Intervention  Provide education and explanation of THRR including how the numbers were predicted and where they are located for reference       Expected Outcomes  Short Term: Able to state/look up THRR;Long Term: Able to use THRR to govern intensity when exercising independently;Short Term: Able to use daily as guideline for intensity in rehab       Able to check pulse independently  Yes       Intervention  Provide education and  demonstration on how to check pulse in carotid and radial arteries.;Review the importance of being able to check your own pulse for safety during independent exercise       Expected Outcomes  Short Term: Able to explain why pulse checking is important during independent exercise;Long Term: Able to check pulse independently and accurately       Understanding of Exercise Prescription  Yes       Intervention  Provide education, explanation, and written materials on patient's individual exercise prescription       Expected Outcomes  Short Term: Able to explain program exercise prescription;Long Term: Able to explain home  exercise prescription to exercise independently          Copy of goals given to participant.

## 2017-03-01 NOTE — Progress Notes (Signed)
Pulmonary Individual Treatment Plan  Patient Details  Name: Mario Ward MRN: [id number] Date of Birth: 24-Jun-1945 Referring Provider:     Pulmonary Rehab from 03/01/2017 in [facility] Cardiac and Pulmonary Rehab  Referring Provider  Raul Del      Initial Encounter Date:    Pulmonary Rehab from 03/01/2017 in [facility] Cardiac and Pulmonary Rehab  Date  03/01/17  Referring Provider  Raul Del      Visit Diagnosis: Chronic obstructive pulmonary disease, unspecified COPD type ([place])  Patient's Home Medications on Admission:  Current Outpatient Medications:  .  albuterol (PROVENTIL) (2.5 MG/3ML) 0.083% nebulizer solution, Take 2.5 mg by nebulization every 2 (two) hours as needed for wheezing or shortness of breath., Disp: , Rfl:  .  aspirin EC 81 MG tablet, Take 81 mg by mouth daily., Disp: , Rfl:  .  azelastine (ASTELIN) 0.1 % nasal spray, Place 1 spray into both nostrils 2 (two) times daily. Use in each nostril as directed, Disp: , Rfl:  .  cholecalciferol (VITAMIN D) 1000 UNITS tablet, Take 2,000 Units by mouth daily., Disp: , Rfl:  .  clopidogrel (PLAVIX) 75 MG tablet, Take 75 mg by mouth daily., Disp: , Rfl:  .  ezetimibe (ZETIA) 10 MG tablet, Take 1 tablet (10 mg total) by mouth daily., Disp: 90 tablet, Rfl: 3 .  fluticasone (FLONASE) 50 MCG/ACT nasal spray, Place into the nose., Disp: , Rfl:  .  Ipratropium-Albuterol (COMBIVENT RESPIMAT) 20-100 MCG/ACT AERS respimat, Inhale 1 puff into the lungs every 6 (six) hours., Disp: , Rfl:  .  lisinopril (PRINIVIL,ZESTRIL) 40 MG tablet, Take 40 mg by mouth daily., Disp: , Rfl:  .  loratadine (CLARITIN) 10 MG tablet, Take 10 mg by mouth daily., Disp: , Rfl:  .  metoprolol tartrate (LOPRESSOR) 25 MG tablet, Take 25 mg by mouth 2 (two) times daily. , Disp: , Rfl:  .  Multiple Vitamin (MULTIVITAMIN) tablet, Take 1 tablet by mouth daily., Disp: , Rfl:  .  nitroGLYCERIN (NITROSTAT) 0.4 MG SL tablet, Place 0.4 mg under the tongue every 5 (five) minutes as  needed for chest pain., Disp: , Rfl:  .  NONFORMULARY OR COMPOUNDED ITEM, See pharmacy note, Disp: 120 each, Rfl: 2 .  Olopatadine HCl (PATADAY) 0.2 % SOLN, Apply 2 drops to eye as needed. , Disp: , Rfl:  .  RABEprazole (ACIPHEX) 20 MG tablet, TAKE 1 TABLET DAILY 45 MINUTES BEFORE BREAKFAST ON AN EMPTY STOMACH, Disp: , Rfl:  .  simvastatin (ZOCOR) 40 MG tablet, TAKE 1 TABLET DAILY, Disp: 90 tablet, Rfl: 3 .  tamsulosin (FLOMAX) 0.4 MG CAPS capsule, Take 1 capsule (0.4 mg total) by mouth daily., Disp: 90 capsule, Rfl: 3 .  vitamin B-12 (CYANOCOBALAMIN) 1000 MCG tablet, Take 1,000 mcg by mouth daily., Disp: , Rfl:   Past Medical History: Past Medical History:  Diagnosis Date  . AAA (abdominal aortic aneurysm) ([place])   . Anginal pain ([place])   . Arthritis   . COPD (chronic obstructive pulmonary disease) ([place])   . Coronary artery disease 1997   CABG x 4   . 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  . 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 GERD (gastroesophageal reflux disease)   . Hypertension   . Sleep apnea    uses a CPAP at night.   . Vitamin D deficiency     Tobacco Use: Social History   Tobacco Use  Smoking Status Former Smoker  .  Packs/day: 1.00  . Years: 40.00  . Pack years: 40.00  . Types: Cigarettes  . Last attempt to quit: 09/07/2006  . Years since quitting: 10.4  Smokeless Tobacco Never Used    Labs: Recent Review Flowsheet Data    There is no flowsheet data to display.       Pulmonary Assessment Scores: Pulmonary Assessment Scores    Row Name 03/01/17 1115         ADL UCSD   ADL Phase  Entry     SOB Score total  31     Rest  0     Walk  2     Stairs  3     Bath  2     Dress  2     Shop  3       CAT Score   CAT Score  12       mMRC Score   mMRC Score  1        Pulmonary Function Assessment: Pulmonary Function Assessment - 03/01/17 1133      Initial Spirometry Results   FVC%  57 %     FEV1%  57 %    FEV1/FVC Ratio  72.66    Comments  good patient effort      Post Bronchodilator Spirometry Results   FVC%  61.56 %    FEV1%  61.56 %    FEV1/FVC Ratio  72.79    Comments  good patient effort      Breath   Bilateral Breath Sounds  Clear    Shortness of Breath  No       Exercise Target Goals: Date: 03/01/17  Exercise Program Goal: Individual exercise prescription set using results from initial 6 min walk test and THRR while considering  patient's activity barriers and safety.    Exercise Prescription Goal: Initial exercise prescription builds to 30-45 minutes a day of aerobic activity, 2-3 days per week.  Home exercise guidelines will be given to patient during program as part of exercise prescription that the participant will acknowledge.  Activity Barriers & Risk Stratification:   6 Minute Walk: 6 Minute Walk    Row Name 03/01/17 1300 03/01/17 1304       6 Minute Walk   Distance  1200 feet  -    Walk Time  6 minutes  -    # of Rest Breaks  0  -    MPH  2.27  -    METS  2.29  -    RPE  11  -    Perceived Dyspnea   2  -    VO2 Peak  8.01  -    Symptoms  No  -    Resting HR  66 bpm  -    Resting BP  136/76  -    Resting Oxygen Saturation   94 %  -    Exercise Oxygen Saturation  during 6 min walk  88 %  -    Max Ex. HR  95 bpm  -    Max Ex. BP  192/78  -    2 Minute Post BP  166/82  -      Interval HR   1 Minute HR  -  82    2 Minute HR  -  84    3 Minute HR  -  86    4 Minute HR  -  85    6 Minute HR  -  95    2 Minute Post HR  -  69    Interval Heart Rate?  -  Yes      Interval Oxygen   Interval Oxygen?  -  Yes    Baseline Oxygen Saturation %  -  94 %    1 Minute Oxygen Saturation %  -  95 %    1 Minute Liters of Oxygen  -  0 L    2 Minute Oxygen Saturation %  -  89 %    2 Minute Liters of Oxygen  -  0 L    3 Minute Oxygen Saturation %  -  88 %    3 Minute Liters of Oxygen  -  0 L    4 Minute Oxygen Saturation %  -  92 %    4  Minute Liters of Oxygen  -  0 L    5 Minute Liters of Oxygen  -  0 L    6 Minute Oxygen Saturation %  -  95 %    6 Minute Liters of Oxygen  -  0 L    2 Minute Post Oxygen Saturation %  -  97 %    2 Minute Post Liters of Oxygen  -  0 L      Oxygen Initial Assessment: Oxygen Initial Assessment - 03/01/17 1130      Home Oxygen   Home Oxygen Device  None    Sleep Oxygen Prescription  CPAP    Liters per minute  0    Home Exercise Oxygen Prescription  None    Home at Rest Exercise Oxygen Prescription  None    Compliance with Home Oxygen Use  Yes      Initial 6 min Walk   Oxygen Used  None      Program Oxygen Prescription   Program Oxygen Prescription  None      Intervention   Short Term Goals  To learn and demonstrate proper use of respiratory medications;To learn and demonstrate proper pursed lip breathing techniques or other breathing techniques.;To learn and understand importance of maintaining oxygen saturations>88%;To learn and understand importance of monitoring SPO2 with pulse oximeter and demonstrate accurate use of the pulse oximeter.    Long  Term Goals  Verbalizes importance of monitoring SPO2 with pulse oximeter and return demonstration;Maintenance of O2 saturations>88%;Compliance with respiratory medication;Exhibits proper breathing techniques, such as pursed lip breathing or other method taught during program session;Demonstrates proper use of MDI's       Oxygen Re-Evaluation:   Oxygen Discharge (Final Oxygen Re-Evaluation):   Initial Exercise Prescription: Initial Exercise Prescription - 03/01/17 1300      Date of Initial Exercise RX and Referring Provider   Date  03/01/17    Referring Provider  Raul Del      Treadmill   MPH  2    Grade  0    Minutes  15    METs  2.5      NuStep   Level  2    SPM  80    Minutes  15    METs  2.2      REL-XR   Level  2    Speed  50    Minutes  15    METs  2.2      Biostep-RELP   Level  3    SPM  50    Minutes   15    METs  2  Prescription Details   Frequency (times per week)  3    Duration  Progress to 45 minutes of aerobic exercise without signs/symptoms of physical distress      Intensity   THRR 40-80% of Max Heartrate  98-130    Ratings of Perceived Exertion  11-13    Perceived Dyspnea  0-4      Resistance Training   Training Prescription  Yes    Weight  3 lb    Reps  10-15       Perform Capillary Blood Glucose checks as needed.  Exercise Prescription Changes:   Exercise Comments:   Exercise Goals and Review: Exercise Goals    Row Name 03/01/17 1259             Exercise Goals   Increase Physical Activity  Yes       Intervention  Provide advice, education, support and counseling about physical activity/exercise needs.;Develop an individualized exercise prescription for aerobic and resistive training based on initial evaluation findings, risk stratification, comorbidities and participant's personal goals.       Expected Outcomes  Short Term: Attend rehab on a regular basis to increase amount of physical activity.;Long Term: Add in home exercise to make exercise part of routine and to increase amount of physical activity.;Long Term: Exercising regularly at least 3-5 days a week.       Increase Strength and Stamina  Yes       Intervention  Provide advice, education, support and counseling about physical activity/exercise needs.;Develop an individualized exercise prescription for aerobic and resistive training based on initial evaluation findings, risk stratification, comorbidities and participant's personal goals.       Expected Outcomes  Short Term: Increase workloads from initial exercise prescription for resistance, speed, and METs.;Short Term: Perform resistance training exercises routinely during rehab and add in resistance training at home;Long Term: Improve cardiorespiratory fitness, muscular endurance and strength as measured by increased METs and functional capacity  (6MWT)       Able to understand and use rate of perceived exertion (RPE) scale  Yes       Intervention  Provide education and explanation on how to use RPE scale       Expected Outcomes  Short Term: Able to use RPE daily in rehab to express subjective intensity level;Long Term:  Able to use RPE to guide intensity level when exercising independently       Able to understand and use Dyspnea scale  Yes       Intervention  Provide education and explanation on how to use Dyspnea scale       Expected Outcomes  Short Term: Able to use Dyspnea scale daily in rehab to express subjective sense of shortness of breath during exertion;Long Term: Able to use Dyspnea scale to guide intensity level when exercising independently       Knowledge and understanding of Target Heart Rate Range (THRR)  Yes       Intervention  Provide education and explanation of THRR including how the numbers were predicted and where they are located for reference       Expected Outcomes  Short Term: Able to state/look up THRR;Long Term: Able to use THRR to govern intensity when exercising independently;Short Term: Able to use daily as guideline for intensity in rehab       Able to check pulse independently  Yes       Intervention  Provide education and demonstration on how to check pulse in carotid and radial  arteries.;Review the importance of being able to check your own pulse for safety during independent exercise       Expected Outcomes  Short Term: Able to explain why pulse checking is important during independent exercise;Long Term: Able to check pulse independently and accurately       Understanding of Exercise Prescription  Yes       Intervention  Provide education, explanation, and written materials on patient's individual exercise prescription       Expected Outcomes  Short Term: Able to explain program exercise prescription;Long Term: Able to explain home exercise prescription to exercise independently          Exercise Goals  Re-Evaluation :   Discharge Exercise Prescription (Final Exercise Prescription Changes):   Nutrition:  Target Goals: Understanding of nutrition guidelines, daily intake of sodium <1516m, cholesterol <2038m calories 30% from fat and 7% or less from saturated fats, daily to have 5 or more servings of fruits and vegetables.  Biometrics: Pre Biometrics - 03/01/17 1259      Pre Biometrics   Height  '5\' 7"'  (1.702 m)    Weight  247 lb 6.4 oz (112.2 kg)    Waist Circumference  45 inches    Hip Circumference  48 inches    Waist to Hip Ratio  0.94 %    BMI (Calculated)  38.74        Nutrition Therapy Plan and Nutrition Goals: Nutrition Therapy & Goals - 03/01/17 1113      Personal Nutrition Goals   Comments  Weight loss. He would like to meet with the dietician.      Intervention Plan   Intervention  Prescribe, educate and counsel regarding individualized specific dietary modifications aiming towards targeted core components such as weight, hypertension, lipid management, diabetes, heart failure and other comorbidities.;Nutrition handout(s) given to patient.    Expected Outcomes  Short Term Goal: Understand basic principles of dietary content, such as calories, fat, sodium, cholesterol and nutrients.;Long Term Goal: Adherence to prescribed nutrition plan.       Nutrition Assessments: Nutrition Assessments - 03/01/17 1113      MEDFICTS Scores   Pre Score  66       Nutrition Goals Re-Evaluation:   Nutrition Goals Discharge (Final Nutrition Goals Re-Evaluation):   Psychosocial: Target Goals: Acknowledge presence or absence of significant depression and/or stress, maximize coping skills, provide positive support system. Participant is able to verbalize types and ability to use techniques and skills needed for reducing stress and depression.   Initial Review & Psychosocial Screening: Initial Psych Review & Screening - 03/01/17 1112      Initial Review   Current issues with   None Identified      Family Dynamics   Good Support System?  Yes    Comments  He looks to his wife and kids for support      Barriers   Psychosocial barriers to participate in program  There are no identifiable barriers or psychosocial needs.      Screening Interventions   Interventions  Encouraged to exercise;Program counselor consult;Provide feedback about the scores to participant;To provide support and resources with identified psychosocial needs    Expected Outcomes  Short Term goal: Utilizing psychosocial counselor, staff and physician to assist with identification of specific Stressors or current issues interfering with healing process. Setting desired goal for each stressor or current issue identified.;Long Term Goal: Stressors or current issues are controlled or eliminated.       Quality of Life Scores:  Scores of 19 and below usually indicate a poorer quality of life in these areas.  A difference of  2-3 points is a clinically meaningful difference.  A difference of 2-3 points in the total score of the Quality of Life Index has been associated with significant improvement in overall quality of life, self-image, physical symptoms, and general health in studies assessing change in quality of life.  PHQ-9: Recent Review Flowsheet Data    Depression screen [facility] 2/9 03/01/2017   Decreased Interest 0   Down, Depressed, Hopeless 0   PHQ - 2 Score 0   Altered sleeping 0   Tired, decreased energy 1   Change in appetite 2   Feeling bad or failure about yourself  0   Trouble concentrating 0   Moving slowly or fidgety/restless 0   Suicidal thoughts 0   PHQ-9 Score 3   Difficult doing work/chores Somewhat difficult     Interpretation of Total Score  Total Score Depression Severity:  1-4 = Minimal depression, 5-9 = Mild depression, 10-14 = Moderate depression, 15-19 = Moderately severe depression, 20-27 = Severe depression   Psychosocial Evaluation and  Intervention:   Psychosocial Re-Evaluation:   Psychosocial Discharge (Final Psychosocial Re-Evaluation):   Education: Education Goals: Education classes will be provided on a weekly basis, covering required topics. Participant will state understanding/return demonstration of topics presented.  Learning Barriers/Preferences: Learning Barriers/Preferences - 03/01/17 1117      Learning Barriers/Preferences   Learning Barriers  Sight wears glasses    Learning Preferences  None       Education Topics:  Initial Evaluation Education: - Verbal, written and demonstration of respiratory meds, oximetry and breathing techniques. Instruction on use of nebulizers and MDIs and importance of monitoring MDI activations.   Pulmonary Rehab from 03/01/2017 in [facility] Cardiac and Pulmonary Rehab  Date  03/01/17  Educator  [facility]  Instruction Review Code  1- Verbalizes Understanding      General Nutrition Guidelines/Fats and Fiber: -Group instruction provided by verbal, written material, models and posters to present the general guidelines for heart healthy nutrition. Gives an explanation and review of dietary fats and fiber.   Controlling Sodium/Reading Food Labels: -Group verbal and written material supporting the discussion of sodium use in heart healthy nutrition. Review and explanation with models, verbal and written materials for utilization of the food label.   Exercise Physiology & General Exercise Guidelines: - Group verbal and written instruction with models to review the exercise physiology of the cardiovascular system and associated critical values. Provides general exercise guidelines with specific guidelines to those with heart or lung disease.    Aerobic Exercise & Resistance Training: - Gives group verbal and written instruction on the various components of exercise. Focuses on aerobic and resistive training programs and the benefits of this training and how to safely progress through  these programs.   Flexibility, Balance, Mind/Body Relaxation: Provides group verbal/written instruction on the benefits of flexibility and balance training, including mind/body exercise modes such as yoga, pilates and tai chi.  Demonstration and skill practice provided.   Stress and Anxiety: - Provides group verbal and written instruction about the health risks of elevated stress and causes of high stress.  Discuss the correlation between heart/lung disease and anxiety and treatment options. Review healthy ways to manage with stress and anxiety.   Depression: - Provides group verbal and written instruction on the correlation between heart/lung disease and depressed mood, treatment options, and the stigmas associated with seeking treatment.  Exercise & Equipment Safety: - Individual verbal instruction and demonstration of equipment use and safety with use of the equipment.   Pulmonary Rehab from 03/01/2017 in [facility] Cardiac and Pulmonary Rehab  Date  03/01/17  Educator  [facility]  Instruction Review Code  1- Verbalizes Understanding      Infection Prevention: - Provides verbal and written material to individual with discussion of infection control including proper hand washing and proper equipment cleaning during exercise session.   Pulmonary Rehab from 03/01/2017 in [facility] Cardiac and Pulmonary Rehab  Date  03/01/17  Educator  [facility]  Instruction Review Code  1- Verbalizes Understanding      Falls Prevention: - Provides verbal and written material to individual with discussion of falls prevention and safety.   Pulmonary Rehab from 03/01/2017 in [facility] Cardiac and Pulmonary Rehab  Date  03/01/17  Educator  [facility]  Instruction Review Code  1- Verbalizes Understanding      Diabetes: - Individual verbal and written instruction to review signs/symptoms of diabetes, desired ranges of glucose level fasting, after meals and with exercise. Advice that pre and post exercise glucose checks will be done for  3 sessions at entry of program.   Chronic Lung Diseases: - Group verbal and written instruction to review updates, respiratory medications, advancements in procedures and treatments. Discuss use of supplemental oxygen including available portable oxygen systems, continuous and intermittent flow rates, concentrators, personal use and safety guidelines. Review proper use of inhaler and spacers. Provide informative websites for self-education.    Energy Conservation: - Provide group verbal and written instruction for methods to conserve energy, plan and organize activities. Instruct on pacing techniques, use of adaptive equipment and posture/positioning to relieve shortness of breath.   Triggers and Exacerbations: - Group verbal and written instruction to review types of environmental triggers and ways to prevent exacerbations. Discuss weather changes, air quality and the benefits of nasal washing. Review warning signs and symptoms to help prevent infections. Discuss techniques for effective airway clearance, coughing, and vibrations.   AED/CPR: - Group verbal and written instruction with the use of models to demonstrate the basic use of the AED with the basic ABC's of resuscitation.   Anatomy and Physiology of the Lungs: - Group verbal and written instruction with the use of models to provide basic lung anatomy and physiology related to function, structure and complications of lung disease.   Anatomy & Physiology of the Heart: - Group verbal and written instruction and models provide basic cardiac anatomy and physiology, with the coronary electrical and arterial systems. Review of Valvular disease and Heart Failure   Cardiac Medications: - Group verbal and written instruction to review commonly prescribed medications for heart disease. Reviews the medication, class of the drug, and side effects.   Know Your Numbers and Risk Factors: -Group verbal and written instruction about important  numbers in your health.  Discussion of what are risk factors and how they play a role in the disease process.  Review of Cholesterol, Blood Pressure, Diabetes, and BMI and the role they play in your overall health.   Sleep Hygiene: -Provides group verbal and written instruction about how sleep can affect your health.  Define sleep hygiene, discuss sleep cycles and impact of sleep habits. Review good sleep hygiene tips.    Other: -Provides group and verbal instruction on various topics (see comments)    Knowledge Questionnaire Score: Knowledge Questionnaire Score - 03/01/17 1118      Knowledge Questionnaire Score   Pre Score  15/18 reviewed with patient        Core Components/Risk Factors/Patient Goals at Admission: Personal Goals and Risk Factors at Admission - 03/01/17 1143      Core Components/Risk Factors/Patient Goals on Admission    Weight Management  Yes;Weight Loss    Intervention  Weight Management: Develop a combined nutrition and exercise program designed to reach desired caloric intake, while maintaining appropriate intake of nutrient and fiber, sodium and fats, and appropriate energy expenditure required for the weight goal.;Weight Management: Provide education and appropriate resources to help participant work on and attain dietary goals.;Weight Management/Obesity: Establish reasonable short term and long term weight goals.;Obesity: Provide education and appropriate resources to help participant work on and attain dietary goals.    Admit Weight  247 lb (112 kg)    Goal Weight: Short Term  242 lb (109.8 kg)    Goal Weight: Long Term  200 lb (90.7 kg)    Expected Outcomes  Short Term: Continue to assess and modify interventions until short term weight is achieved;Long Term: Adherence to nutrition and physical activity/exercise program aimed toward attainment of established weight goal;Weight Loss: Understanding of general recommendations for a balanced deficit meal plan, which  promotes 1-2 lb weight loss per week and includes a negative energy balance of [phone] kcal/d;Understanding recommendations for meals to include 15-35% energy as protein, 25-35% energy from fat, 35-60% energy from carbohydrates, less than 237m of dietary cholesterol, 20-35 gm of total fiber daily;Understanding of distribution of calorie intake throughout the day with the consumption of 4-5 meals/snacks    Improve shortness of breath with ADL's  Yes    Intervention  Provide education, individualized exercise plan and daily activity instruction to help decrease symptoms of SOB with activities of daily living.    Expected Outcomes  Short Term: Improve cardiorespiratory fitness to achieve a reduction of symptoms when performing ADLs;Long Term: Be able to perform more ADLs without symptoms or delay the onset of symptoms    Diabetes  Yes    Intervention  Provide education about proper nutrition, including hydration, and aerobic/resistive exercise prescription along with prescribed medications to achieve blood glucose in normal ranges: Fasting glucose 65-99 mg/dL;Provide education about signs/symptoms and action to take for hypo/hyperglycemia.    Expected Outcomes  Short Term: Participant verbalizes understanding of the signs/symptoms and immediate care of hyper/hypoglycemia, proper foot care and importance of medication, aerobic/resistive exercise and nutrition plan for blood glucose control.;Long Term: Attainment of HbA1C < 7%.    Heart Failure  Yes    Intervention  Provide a combined exercise and nutrition program that is supplemented with education, support and counseling about heart failure. Directed toward relieving symptoms such as shortness of breath, decreased exercise tolerance, and extremity edema.    Expected Outcomes  Improve functional capacity of life;Short term: Attendance in program 2-3 days a week with increased exercise capacity. Reported lower sodium intake. Reported increased fruit and  vegetable intake. Reports medication compliance.;Short term: Daily weights obtained and reported for increase. Utilizing diuretic protocols set by physician.;Long term: Adoption of self-care skills and reduction of barriers for early signs and symptoms recognition and intervention leading to self-care maintenance.    Hypertension  Yes    Intervention  Provide education on lifestyle modifcations including regular physical activity/exercise, weight management, moderate sodium restriction and increased consumption of fresh fruit, vegetables, and low fat dairy, alcohol moderation, and smoking cessation.;Monitor prescription use compliance.    Expected Outcomes  Short Term: Continued assessment and intervention until BP is <  140/54m HG in hypertensive participants. < 130/844mHG in hypertensive participants with diabetes, heart failure or chronic kidney disease.;Long Term: Maintenance of blood pressure at goal levels.    Lipids  Yes medications prescribed becuase of Bypass    Intervention  Provide education and support for participant on nutrition & aerobic/resistive exercise along with prescribed medications to achieve LDL <706mHDL >6m77m  Expected Outcomes  Short Term: Participant states understanding of desired cholesterol values and is compliant with medications prescribed. Participant is following exercise prescription and nutrition guidelines.;Long Term: Cholesterol controlled with medications as prescribed, with individualized exercise RX and with personalized nutrition plan. Value goals: LDL < 70mg24mL > 40 mg.       Core Components/Risk Factors/Patient Goals Review:    Core Components/Risk Factors/Patient Goals at Discharge (Final Review):    ITP Comments: ITP Comments    Row Name 03/01/17 1052           ITP Comments  Medical Evaluation completed. Chart sent for review and changes to Dr. Mark Emily Filbertctor of [facility] found [facility]16           Comments: Initial ITP

## 2017-03-07 ENCOUNTER — Telehealth: Payer: Self-pay

## 2017-03-07 ENCOUNTER — Encounter: Payer: Medicare Other | Attending: Specialist

## 2017-03-07 DIAGNOSIS — J449 Chronic obstructive pulmonary disease, unspecified: Secondary | ICD-10-CM | POA: Insufficient documentation

## 2017-03-07 NOTE — Telephone Encounter (Signed)
Mario Ward called to say that he wont be able to start the program until Feb 25th 2019 due to a surgery procedure on the 20th. He does not want to start and then be out for a week. Informed patient that he is to start on the 25th at 1130 AM.

## 2017-03-08 ENCOUNTER — Telehealth: Payer: Self-pay | Admitting: Urology

## 2017-03-08 NOTE — Telephone Encounter (Signed)
Mario Ward called office stating his pharmacy ([facility]) has sent several refill requests to our office for refill of his Tamsolusin and pharmacy states they have not received anything back from our office.  Mario Ward is asking if Dr. Pilar Jarvis has stopped this medication for some reason and if so, why? Please advise. Thanks.

## 2017-03-10 MED ORDER — TAMSULOSIN HCL 0.4 MG PO CAPS: 0.4000 mg | ORAL_CAPSULE | Freq: Every day | ORAL | 3 refills | Status: AC

## 2017-03-10 NOTE — Telephone Encounter (Signed)
Medication was sent to [facility].

## 2017-03-14 DIAGNOSIS — J449 Chronic obstructive pulmonary disease, unspecified: Secondary | ICD-10-CM

## 2017-03-14 NOTE — Progress Notes (Signed)
Pulmonary Individual Treatment Plan  Patient Details  Name: Mario Ward MRN: [id number] Date of Birth: Jan 18, 1946 Referring Provider:     Pulmonary Rehab from 03/01/2017 in [facility] Cardiac and Pulmonary Rehab  Referring Provider  Raul Del      Initial Encounter Date:    Pulmonary Rehab from 03/01/2017 in [facility] Cardiac and Pulmonary Rehab  Date  03/01/17  Referring Provider  Raul Del      Visit Diagnosis: Chronic obstructive pulmonary disease, unspecified COPD type ([place])  Patient's Home Medications on Admission:  Current Outpatient Medications:  .  albuterol (PROVENTIL) (2.5 MG/3ML) 0.083% nebulizer solution, Take 2.5 mg by nebulization every 2 (two) hours as needed for wheezing or shortness of breath., Disp: , Rfl:  .  aspirin EC 81 MG tablet, Take 81 mg by mouth daily., Disp: , Rfl:  .  azelastine (ASTELIN) 0.1 % nasal spray, Place 1 spray into both nostrils 2 (two) times daily. Use in each nostril as directed, Disp: , Rfl:  .  cholecalciferol (VITAMIN D) 1000 UNITS tablet, Take 2,000 Units by mouth daily., Disp: , Rfl:  .  clopidogrel (PLAVIX) 75 MG tablet, Take 75 mg by mouth daily., Disp: , Rfl:  .  ezetimibe (ZETIA) 10 MG tablet, Take 1 tablet (10 mg total) by mouth daily., Disp: 90 tablet, Rfl: 3 .  fluticasone (FLONASE) 50 MCG/ACT nasal spray, Place into the nose., Disp: , Rfl:  .  Ipratropium-Albuterol (COMBIVENT RESPIMAT) 20-100 MCG/ACT AERS respimat, Inhale 1 puff into the lungs every 6 (six) hours., Disp: , Rfl:  .  lisinopril (PRINIVIL,ZESTRIL) 40 MG tablet, Take 40 mg by mouth daily., Disp: , Rfl:  .  loratadine (CLARITIN) 10 MG tablet, Take 10 mg by mouth daily., Disp: , Rfl:  .  metoprolol tartrate (LOPRESSOR) 25 MG tablet, Take 25 mg by mouth 2 (two) times daily. , Disp: , Rfl:  .  Multiple Vitamin (MULTIVITAMIN) tablet, Take 1 tablet by mouth daily., Disp: , Rfl:  .  nitroGLYCERIN (NITROSTAT) 0.4 MG SL tablet, Place 0.4 mg under the tongue every 5 (five) minutes as  needed for chest pain., Disp: , Rfl:  .  NONFORMULARY OR COMPOUNDED ITEM, See pharmacy note, Disp: 120 each, Rfl: 2 .  Olopatadine HCl (PATADAY) 0.2 % SOLN, Apply 2 drops to eye as needed. , Disp: , Rfl:  .  RABEprazole (ACIPHEX) 20 MG tablet, TAKE 1 TABLET DAILY 45 MINUTES BEFORE BREAKFAST ON AN EMPTY STOMACH, Disp: , Rfl:  .  simvastatin (ZOCOR) 40 MG tablet, TAKE 1 TABLET DAILY, Disp: 90 tablet, Rfl: 3 .  tamsulosin (FLOMAX) 0.4 MG CAPS capsule, Take 1 capsule (0.4 mg total) by mouth daily., Disp: 90 capsule, Rfl: 3 .  vitamin B-12 (CYANOCOBALAMIN) 1000 MCG tablet, Take 1,000 mcg by mouth daily., Disp: , Rfl:   Past Medical History: Past Medical History:  Diagnosis Date  . AAA (abdominal aortic aneurysm) ([place])   . Anginal pain ([place])   . Arthritis   . COPD (chronic obstructive pulmonary disease) ([place])   . Coronary artery disease 1997   CABG x 4   . 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  . 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 GERD (gastroesophageal reflux disease)   . Hypertension   . Sleep apnea    uses a CPAP at night.   . Vitamin D deficiency     Tobacco Use: Social History   Tobacco Use  Smoking Status Former Smoker  .  Packs/day: 1.00  . Years: 40.00  . Pack years: 40.00  . Types: Cigarettes  . Last attempt to quit: 09/07/2006  . Years since quitting: 10.5  Smokeless Tobacco Never Used    Labs: Recent Review Flowsheet Data    There is no flowsheet data to display.       Pulmonary Assessment Scores: Pulmonary Assessment Scores    Row Name 03/01/17 1115         ADL UCSD   ADL Phase  Entry     SOB Score total  31     Rest  0     Walk  2     Stairs  3     Bath  2     Dress  2     Shop  3       CAT Score   CAT Score  12       mMRC Score   mMRC Score  1        Pulmonary Function Assessment: Pulmonary Function Assessment - 03/01/17 1133      Initial Spirometry Results   FVC%  57 %     FEV1%  57 %    FEV1/FVC Ratio  72.66    Comments  good patient effort      Post Bronchodilator Spirometry Results   FVC%  61.56 %    FEV1%  61.56 %    FEV1/FVC Ratio  72.79    Comments  good patient effort      Breath   Bilateral Breath Sounds  Clear    Shortness of Breath  No       Exercise Target Goals:    Exercise Program Goal: Individual exercise prescription set using results from initial 6 min walk test and THRR while considering  patient's activity barriers and safety.    Exercise Prescription Goal: Initial exercise prescription builds to 30-45 minutes a day of aerobic activity, 2-3 days per week.  Home exercise guidelines will be given to patient during program as part of exercise prescription that the participant will acknowledge.  Activity Barriers & Risk Stratification:   6 Minute Walk: 6 Minute Walk    Row Name 03/01/17 1300 03/01/17 1304       6 Minute Walk   Distance  1200 feet  -    Walk Time  6 minutes  -    # of Rest Breaks  0  -    MPH  2.27  -    METS  2.29  -    RPE  11  -    Perceived Dyspnea   2  -    VO2 Peak  8.01  -    Symptoms  No  -    Resting HR  66 bpm  -    Resting BP  136/76  -    Resting Oxygen Saturation   94 %  -    Exercise Oxygen Saturation  during 6 min walk  88 %  -    Max Ex. HR  95 bpm  -    Max Ex. BP  192/78  -    2 Minute Post BP  166/82  -      Interval HR   1 Minute HR  -  82    2 Minute HR  -  84    3 Minute HR  -  86    4 Minute HR  -  85    6 Minute HR  -  95    2 Minute Post HR  -  69    Interval Heart Rate?  -  Yes      Interval Oxygen   Interval Oxygen?  -  Yes    Baseline Oxygen Saturation %  -  94 %    1 Minute Oxygen Saturation %  -  95 %    1 Minute Liters of Oxygen  -  0 L    2 Minute Oxygen Saturation %  -  89 %    2 Minute Liters of Oxygen  -  0 L    3 Minute Oxygen Saturation %  -  88 %    3 Minute Liters of Oxygen  -  0 L    4 Minute Oxygen Saturation %  -  92 %    4 Minute Liters of  Oxygen  -  0 L    5 Minute Liters of Oxygen  -  0 L    6 Minute Oxygen Saturation %  -  95 %    6 Minute Liters of Oxygen  -  0 L    2 Minute Post Oxygen Saturation %  -  97 %    2 Minute Post Liters of Oxygen  -  0 L      Oxygen Initial Assessment: Oxygen Initial Assessment - 03/01/17 1130      Home Oxygen   Home Oxygen Device  None    Sleep Oxygen Prescription  CPAP    Liters per minute  0    Home Exercise Oxygen Prescription  None    Home at Rest Exercise Oxygen Prescription  None    Compliance with Home Oxygen Use  Yes      Initial 6 min Walk   Oxygen Used  None      Program Oxygen Prescription   Program Oxygen Prescription  None      Intervention   Short Term Goals  To learn and demonstrate proper use of respiratory medications;To learn and demonstrate proper pursed lip breathing techniques or other breathing techniques.;To learn and understand importance of maintaining oxygen saturations>88%;To learn and understand importance of monitoring SPO2 with pulse oximeter and demonstrate accurate use of the pulse oximeter.    Long  Term Goals  Verbalizes importance of monitoring SPO2 with pulse oximeter and return demonstration;Maintenance of O2 saturations>88%;Compliance with respiratory medication;Exhibits proper breathing techniques, such as pursed lip breathing or other method taught during program session;Demonstrates proper use of MDI's       Oxygen Re-Evaluation:   Oxygen Discharge (Final Oxygen Re-Evaluation):   Initial Exercise Prescription: Initial Exercise Prescription - 03/01/17 1300      Date of Initial Exercise RX and Referring Provider   Date  03/01/17    Referring Provider  Raul Del      Treadmill   MPH  2    Grade  0    Minutes  15    METs  2.5      NuStep   Level  2    SPM  80    Minutes  15    METs  2.2      REL-XR   Level  2    Speed  50    Minutes  15    METs  2.2      Biostep-RELP   Level  3    SPM  50    Minutes  15    METs  2  Prescription Details   Frequency (times per week)  3    Duration  Progress to 45 minutes of aerobic exercise without signs/symptoms of physical distress      Intensity   THRR 40-80% of Max Heartrate  98-130    Ratings of Perceived Exertion  11-13    Perceived Dyspnea  0-4      Resistance Training   Training Prescription  Yes    Weight  3 lb    Reps  10-15       Perform Capillary Blood Glucose checks as needed.  Exercise Prescription Changes:   Exercise Comments:   Exercise Goals and Review: Exercise Goals    Row Name 03/01/17 1259             Exercise Goals   Increase Physical Activity  Yes       Intervention  Provide advice, education, support and counseling about physical activity/exercise needs.;Develop an individualized exercise prescription for aerobic and resistive training based on initial evaluation findings, risk stratification, comorbidities and participant's personal goals.       Expected Outcomes  Short Term: Attend rehab on a regular basis to increase amount of physical activity.;Long Term: Add in home exercise to make exercise part of routine and to increase amount of physical activity.;Long Term: Exercising regularly at least 3-5 days a week.       Increase Strength and Stamina  Yes       Intervention  Provide advice, education, support and counseling about physical activity/exercise needs.;Develop an individualized exercise prescription for aerobic and resistive training based on initial evaluation findings, risk stratification, comorbidities and participant's personal goals.       Expected Outcomes  Short Term: Increase workloads from initial exercise prescription for resistance, speed, and METs.;Short Term: Perform resistance training exercises routinely during rehab and add in resistance training at home;Long Term: Improve cardiorespiratory fitness, muscular endurance and strength as measured by increased METs and functional capacity (6MWT)       Able to  understand and use rate of perceived exertion (RPE) scale  Yes       Intervention  Provide education and explanation on how to use RPE scale       Expected Outcomes  Short Term: Able to use RPE daily in rehab to express subjective intensity level;Long Term:  Able to use RPE to guide intensity level when exercising independently       Able to understand and use Dyspnea scale  Yes       Intervention  Provide education and explanation on how to use Dyspnea scale       Expected Outcomes  Short Term: Able to use Dyspnea scale daily in rehab to express subjective sense of shortness of breath during exertion;Long Term: Able to use Dyspnea scale to guide intensity level when exercising independently       Knowledge and understanding of Target Heart Rate Range (THRR)  Yes       Intervention  Provide education and explanation of THRR including how the numbers were predicted and where they are located for reference       Expected Outcomes  Short Term: Able to state/look up THRR;Long Term: Able to use THRR to govern intensity when exercising independently;Short Term: Able to use daily as guideline for intensity in rehab       Able to check pulse independently  Yes       Intervention  Provide education and demonstration on how to check pulse in carotid and radial  arteries.;Review the importance of being able to check your own pulse for safety during independent exercise       Expected Outcomes  Short Term: Able to explain why pulse checking is important during independent exercise;Long Term: Able to check pulse independently and accurately       Understanding of Exercise Prescription  Yes       Intervention  Provide education, explanation, and written materials on patient's individual exercise prescription       Expected Outcomes  Short Term: Able to explain program exercise prescription;Long Term: Able to explain home exercise prescription to exercise independently          Exercise Goals Re-Evaluation  :   Discharge Exercise Prescription (Final Exercise Prescription Changes):   Nutrition:  Target Goals: Understanding of nutrition guidelines, daily intake of sodium <1525m, cholesterol <2029m calories 30% from fat and 7% or less from saturated fats, daily to have 5 or more servings of fruits and vegetables.  Biometrics: Pre Biometrics - 03/01/17 1259      Pre Biometrics   Height  '5\' 7"'  (1.702 m)    Weight  247 lb 6.4 oz (112.2 kg)    Waist Circumference  45 inches    Hip Circumference  48 inches    Waist to Hip Ratio  0.94 %    BMI (Calculated)  38.74        Nutrition Therapy Plan and Nutrition Goals: Nutrition Therapy & Goals - 03/01/17 1113      Personal Nutrition Goals   Comments  Weight loss. He would like to meet with the dietician.      Intervention Plan   Intervention  Prescribe, educate and counsel regarding individualized specific dietary modifications aiming towards targeted core components such as weight, hypertension, lipid management, diabetes, heart failure and other comorbidities.;Nutrition handout(s) given to patient.    Expected Outcomes  Short Term Goal: Understand basic principles of dietary content, such as calories, fat, sodium, cholesterol and nutrients.;Long Term Goal: Adherence to prescribed nutrition plan.       Nutrition Assessments: Nutrition Assessments - 03/01/17 1113      MEDFICTS Scores   Pre Score  66       Nutrition Goals Re-Evaluation:   Nutrition Goals Discharge (Final Nutrition Goals Re-Evaluation):   Psychosocial: Target Goals: Acknowledge presence or absence of significant depression and/or stress, maximize coping skills, provide positive support system. Participant is able to verbalize types and ability to use techniques and skills needed for reducing stress and depression.   Initial Review & Psychosocial Screening: Initial Psych Review & Screening - 03/01/17 1112      Initial Review   Current issues with  None  Identified      Family Dynamics   Good Support System?  Yes    Comments  He looks to his wife and kids for support      Barriers   Psychosocial barriers to participate in program  There are no identifiable barriers or psychosocial needs.      Screening Interventions   Interventions  Encouraged to exercise;Program counselor consult;Provide feedback about the scores to participant;To provide support and resources with identified psychosocial needs    Expected Outcomes  Short Term goal: Utilizing psychosocial counselor, staff and physician to assist with identification of specific Stressors or current issues interfering with healing process. Setting desired goal for each stressor or current issue identified.;Long Term Goal: Stressors or current issues are controlled or eliminated.       Quality of Life Scores:  Scores of 19 and below usually indicate a poorer quality of life in these areas.  A difference of  2-3 points is a clinically meaningful difference.  A difference of 2-3 points in the total score of the Quality of Life Index has been associated with significant improvement in overall quality of life, self-image, physical symptoms, and general health in studies assessing change in quality of life.  PHQ-9: Recent Review Flowsheet Data    Depression screen [facility] 2/9 03/01/2017   Decreased Interest 0   Down, Depressed, Hopeless 0   PHQ - 2 Score 0   Altered sleeping 0   Tired, decreased energy 1   Change in appetite 2   Feeling bad or failure about yourself  0   Trouble concentrating 0   Moving slowly or fidgety/restless 0   Suicidal thoughts 0   PHQ-9 Score 3   Difficult doing work/chores Somewhat difficult     Interpretation of Total Score  Total Score Depression Severity:  1-4 = Minimal depression, 5-9 = Mild depression, 10-14 = Moderate depression, 15-19 = Moderately severe depression, 20-27 = Severe depression   Psychosocial Evaluation and Intervention:   Psychosocial  Re-Evaluation:   Psychosocial Discharge (Final Psychosocial Re-Evaluation):   Education: Education Goals: Education classes will be provided on a weekly basis, covering required topics. Participant will state understanding/return demonstration of topics presented.  Learning Barriers/Preferences: Learning Barriers/Preferences - 03/01/17 1117      Learning Barriers/Preferences   Learning Barriers  Sight wears glasses    Learning Preferences  None       Education Topics:  Initial Evaluation Education: - Verbal, written and demonstration of respiratory meds, oximetry and breathing techniques. Instruction on use of nebulizers and MDIs and importance of monitoring MDI activations.   Pulmonary Rehab from 03/01/2017 in [facility] Cardiac and Pulmonary Rehab  Date  03/01/17  Educator  [facility]  Instruction Review Code  1- Verbalizes Understanding      General Nutrition Guidelines/Fats and Fiber: -Group instruction provided by verbal, written material, models and posters to present the general guidelines for heart healthy nutrition. Gives an explanation and review of dietary fats and fiber.   Controlling Sodium/Reading Food Labels: -Group verbal and written material supporting the discussion of sodium use in heart healthy nutrition. Review and explanation with models, verbal and written materials for utilization of the food label.   Exercise Physiology & General Exercise Guidelines: - Group verbal and written instruction with models to review the exercise physiology of the cardiovascular system and associated critical values. Provides general exercise guidelines with specific guidelines to those with heart or lung disease.    Aerobic Exercise & Resistance Training: - Gives group verbal and written instruction on the various components of exercise. Focuses on aerobic and resistive training programs and the benefits of this training and how to safely progress through these  programs.   Flexibility, Balance, Mind/Body Relaxation: Provides group verbal/written instruction on the benefits of flexibility and balance training, including mind/body exercise modes such as yoga, pilates and tai chi.  Demonstration and skill practice provided.   Stress and Anxiety: - Provides group verbal and written instruction about the health risks of elevated stress and causes of high stress.  Discuss the correlation between heart/lung disease and anxiety and treatment options. Review healthy ways to manage with stress and anxiety.   Depression: - Provides group verbal and written instruction on the correlation between heart/lung disease and depressed mood, treatment options, and the stigmas associated with seeking treatment.  Exercise & Equipment Safety: - Individual verbal instruction and demonstration of equipment use and safety with use of the equipment.   Pulmonary Rehab from 03/01/2017 in [facility] Cardiac and Pulmonary Rehab  Date  03/01/17  Educator  [facility]  Instruction Review Code  1- Verbalizes Understanding      Infection Prevention: - Provides verbal and written material to individual with discussion of infection control including proper hand washing and proper equipment cleaning during exercise session.   Pulmonary Rehab from 03/01/2017 in [facility] Cardiac and Pulmonary Rehab  Date  03/01/17  Educator  [facility]  Instruction Review Code  1- Verbalizes Understanding      Falls Prevention: - Provides verbal and written material to individual with discussion of falls prevention and safety.   Pulmonary Rehab from 03/01/2017 in [facility] Cardiac and Pulmonary Rehab  Date  03/01/17  Educator  [facility]  Instruction Review Code  1- Verbalizes Understanding      Diabetes: - Individual verbal and written instruction to review signs/symptoms of diabetes, desired ranges of glucose level fasting, after meals and with exercise. Advice that pre and post exercise glucose checks will be done for 3  sessions at entry of program.   Chronic Lung Diseases: - Group verbal and written instruction to review updates, respiratory medications, advancements in procedures and treatments. Discuss use of supplemental oxygen including available portable oxygen systems, continuous and intermittent flow rates, concentrators, personal use and safety guidelines. Review proper use of inhaler and spacers. Provide informative websites for self-education.    Energy Conservation: - Provide group verbal and written instruction for methods to conserve energy, plan and organize activities. Instruct on pacing techniques, use of adaptive equipment and posture/positioning to relieve shortness of breath.   Triggers and Exacerbations: - Group verbal and written instruction to review types of environmental triggers and ways to prevent exacerbations. Discuss weather changes, air quality and the benefits of nasal washing. Review warning signs and symptoms to help prevent infections. Discuss techniques for effective airway clearance, coughing, and vibrations.   AED/CPR: - Group verbal and written instruction with the use of models to demonstrate the basic use of the AED with the basic ABC's of resuscitation.   Anatomy and Physiology of the Lungs: - Group verbal and written instruction with the use of models to provide basic lung anatomy and physiology related to function, structure and complications of lung disease.   Anatomy & Physiology of the Heart: - Group verbal and written instruction and models provide basic cardiac anatomy and physiology, with the coronary electrical and arterial systems. Review of Valvular disease and Heart Failure   Cardiac Medications: - Group verbal and written instruction to review commonly prescribed medications for heart disease. Reviews the medication, class of the drug, and side effects.   Know Your Numbers and Risk Factors: -Group verbal and written instruction about important  numbers in your health.  Discussion of what are risk factors and how they play a role in the disease process.  Review of Cholesterol, Blood Pressure, Diabetes, and BMI and the role they play in your overall health.   Sleep Hygiene: -Provides group verbal and written instruction about how sleep can affect your health.  Define sleep hygiene, discuss sleep cycles and impact of sleep habits. Review good sleep hygiene tips.    Other: -Provides group and verbal instruction on various topics (see comments)    Knowledge Questionnaire Score: Knowledge Questionnaire Score - 03/01/17 1118      Knowledge Questionnaire Score   Pre Score  15/18 reviewed with patient        Core Components/Risk Factors/Patient Goals at Admission: Personal Goals and Risk Factors at Admission - 03/01/17 1143      Core Components/Risk Factors/Patient Goals on Admission    Weight Management  Yes;Weight Loss    Intervention  Weight Management: Develop a combined nutrition and exercise program designed to reach desired caloric intake, while maintaining appropriate intake of nutrient and fiber, sodium and fats, and appropriate energy expenditure required for the weight goal.;Weight Management: Provide education and appropriate resources to help participant work on and attain dietary goals.;Weight Management/Obesity: Establish reasonable short term and long term weight goals.;Obesity: Provide education and appropriate resources to help participant work on and attain dietary goals.    Admit Weight  247 lb (112 kg)    Goal Weight: Short Term  242 lb (109.8 kg)    Goal Weight: Long Term  200 lb (90.7 kg)    Expected Outcomes  Short Term: Continue to assess and modify interventions until short term weight is achieved;Long Term: Adherence to nutrition and physical activity/exercise program aimed toward attainment of established weight goal;Weight Loss: Understanding of general recommendations for a balanced deficit meal plan, which  promotes 1-2 lb weight loss per week and includes a negative energy balance of [phone] kcal/d;Understanding recommendations for meals to include 15-35% energy as protein, 25-35% energy from fat, 35-60% energy from carbohydrates, less than 267m of dietary cholesterol, 20-35 gm of total fiber daily;Understanding of distribution of calorie intake throughout the day with the consumption of 4-5 meals/snacks    Improve shortness of breath with ADL's  Yes    Intervention  Provide education, individualized exercise plan and daily activity instruction to help decrease symptoms of SOB with activities of daily living.    Expected Outcomes  Short Term: Improve cardiorespiratory fitness to achieve a reduction of symptoms when performing ADLs;Long Term: Be able to perform more ADLs without symptoms or delay the onset of symptoms    Diabetes  Yes    Intervention  Provide education about proper nutrition, including hydration, and aerobic/resistive exercise prescription along with prescribed medications to achieve blood glucose in normal ranges: Fasting glucose 65-99 mg/dL;Provide education about signs/symptoms and action to take for hypo/hyperglycemia.    Expected Outcomes  Short Term: Participant verbalizes understanding of the signs/symptoms and immediate care of hyper/hypoglycemia, proper foot care and importance of medication, aerobic/resistive exercise and nutrition plan for blood glucose control.;Long Term: Attainment of HbA1C < 7%.    Heart Failure  Yes    Intervention  Provide a combined exercise and nutrition program that is supplemented with education, support and counseling about heart failure. Directed toward relieving symptoms such as shortness of breath, decreased exercise tolerance, and extremity edema.    Expected Outcomes  Improve functional capacity of life;Short term: Attendance in program 2-3 days a week with increased exercise capacity. Reported lower sodium intake. Reported increased fruit and  vegetable intake. Reports medication compliance.;Short term: Daily weights obtained and reported for increase. Utilizing diuretic protocols set by physician.;Long term: Adoption of self-care skills and reduction of barriers for early signs and symptoms recognition and intervention leading to self-care maintenance.    Hypertension  Yes    Intervention  Provide education on lifestyle modifcations including regular physical activity/exercise, weight management, moderate sodium restriction and increased consumption of fresh fruit, vegetables, and low fat dairy, alcohol moderation, and smoking cessation.;Monitor prescription use compliance.    Expected Outcomes  Short Term: Continued assessment and intervention until BP is <  140/44m HG in hypertensive participants. < 130/835mHG in hypertensive participants with diabetes, heart failure or chronic kidney disease.;Long Term: Maintenance of blood pressure at goal levels.    Lipids  Yes medications prescribed becuase of Bypass    Intervention  Provide education and support for participant on nutrition & aerobic/resistive exercise along with prescribed medications to achieve LDL <70104mHDL >16m75m  Expected Outcomes  Short Term: Participant states understanding of desired cholesterol values and is compliant with medications prescribed. Participant is following exercise prescription and nutrition guidelines.;Long Term: Cholesterol controlled with medications as prescribed, with individualized exercise RX and with personalized nutrition plan. Value goals: LDL < 70mg30mL > 40 mg.       Core Components/Risk Factors/Patient Goals Review:    Core Components/Risk Factors/Patient Goals at Discharge (Final Review):    ITP Comments: ITP Comments    Row Name 03/01/17 1052 03/07/17 0812 03/14/17 0938       ITP Comments  Medical Evaluation completed. Chart sent for review and changes to Dr. Mark Emily Filbertctor of [facility] can be found [facility]  03/01/16  Ira called to say that he wont be able to start the program until Feb 25th 2019 due to a surgery procedure on the 20th. He does not want to start and then be out for a week. Informed patient that he is to start on the 25th at 1130 AM.  30 day review completed. ITP sent to Dr. Mark Emily Filbertctor of [facility] with ITP unless changes are made by physician.        Comments: 30 day review

## 2017-03-16 ENCOUNTER — Encounter: Payer: Self-pay | Admitting: Cardiology

## 2017-03-17 ENCOUNTER — Ambulatory Visit (INDEPENDENT_AMBULATORY_CARE_PROVIDER_SITE_OTHER): Payer: Medicare Other | Admitting: Urology

## 2017-03-17 ENCOUNTER — Encounter: Payer: Self-pay | Admitting: Urology

## 2017-03-17 VITALS — BP 156/84 | HR 66 | Ht 67.0 in | Wt 251.8 lb

## 2017-03-17 DIAGNOSIS — N400 Enlarged prostate without lower urinary tract symptoms: Secondary | ICD-10-CM | POA: Diagnosis not present

## 2017-03-17 NOTE — Progress Notes (Signed)
03/17/2017 11:13 AM   [facility] [birth year] [id number]  Referring provider: Sofie Hartigan, MD [place], [place] [id number]  Chief Complaint  Patient presents with  . Benign Prostatic Hypertrophy    HPI: The patient is a 72 year old gentleman with a history of AAA status post stent, CABG 4, diabetes, sleep apnea on CPAP, CAD who presents today for annual follow up for BPH.  1. BPH Currently symptoms well controlled on Flomax.  His only complaint is mild postvoid dribbling.  Does not have nocturia.  He has a good stream.  He empties his bladder.  He has no dysuria.  No hesitancy or intermittency.  No urgency.  2. Low testosterone Recent testosterone was 88 in fall 2018. Patient is no longer sexually active. Does have a penile prosthesis but does not use it. Patient was recently seen with chief complaint of shrinking testicles.   Due to his significant multiple medical comorbidities, decision was made not to start testosterone replacement at his last visit.  3.Erectile dysfunction Penile prosthesis was placed 24 years ago. He is no longer using it and is not sexually active for at least 3 years or longer. He is not interested in obtaining erections.  His biggest concern continues to be the auto inflation 3-4 times per week.  He previously has not been interested in surgery for this.      PMH: Past Medical History:  Diagnosis Date  . AAA (abdominal aortic aneurysm) ([place])   . Anginal pain ([place])   . Arthritis   . COPD (chronic obstructive pulmonary disease) ([place])   . Coronary artery disease 1997   CABG x 4   . Diabetes mellitus without complication [facility])    Patient states he had Gastric Bypass and is no longer on meds.  . 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 GERD (gastroesophageal reflux disease)   . Hypertension   . Sleep apnea    uses a CPAP at night.   . Vitamin D deficiency     Surgical History: Past Surgical History:  Procedure Laterality Date  .  BARIATRIC SURGERY    . CARDIAC CATHETERIZATION  05/03/2013   [facility] in [place]  . CHOLECYSTECTOMY    . CORONARY ARTERY BYPASS GRAFT  1997   CABG x 4 in [place]   . EYE SURGERY Bilateral    Cataract Extraction with IOL  . JOINT REPLACEMENT Right    total knee replacement  . PENILE PROSTHESIS IMPLANT    . PERIPHERAL VASCULAR CATHETERIZATION N/A 04/02/2015   Procedure: Endovascular Repair/Stent Graft;  Surgeon: Katha Cabal, MD;  Location: [facility];  Service: Cardiovascular;  Laterality: N/A;  . REPLACEMENT TOTAL KNEE     right    Home Medications:  Allergies as of 03/17/2017   No Known Allergies     Medication List        Accurate as of 03/17/17 11:13 AM. Always use your most recent med list.          albuterol (2.5 MG/3ML) 0.083% nebulizer solution Commonly known as:  PROVENTIL Take 2.5 mg by nebulization every 2 (two) hours as needed for wheezing or shortness of breath.   aspirin EC 81 MG tablet Take 81 mg by mouth daily.   cholecalciferol 1000 units tablet Commonly known as:  VITAMIN D Take 2,000 Units by mouth daily.   clopidogrel 75 MG tablet Commonly known as:  PLAVIX Take 75 mg by mouth daily.   COMBIVENT RESPIMAT 20-100 MCG/ACT Aers  respimat Generic drug:  Ipratropium-Albuterol Inhale 1 puff into the lungs every 6 (six) hours.   ICAPS AREDS 2 PO Take by mouth.   lisinopril 40 MG tablet Commonly known as:  PRINIVIL,ZESTRIL Take 40 mg by mouth daily.   loratadine 10 MG tablet Commonly known as:  CLARITIN Take 10 mg by mouth daily.   metoprolol tartrate 25 MG tablet Commonly known as:  LOPRESSOR Take 25 mg by mouth 2 (two) times daily.   multivitamin tablet Take 1 tablet by mouth daily.   nitroGLYCERIN 0.4 MG SL tablet Commonly known as:  NITROSTAT Place 0.4 mg under the tongue every 5 (five) minutes as needed for chest pain.   PATADAY 0.2 % Soln Generic drug:  Olopatadine HCl Apply 2 drops to eye as needed.   RABEprazole 20  MG tablet Commonly known as:  ACIPHEX TAKE 1 TABLET DAILY 45 MINUTES BEFORE BREAKFAST ON AN EMPTY STOMACH   simvastatin 40 MG tablet Commonly known as:  ZOCOR TAKE 1 TABLET DAILY   SPIRIVA HANDIHALER 18 MCG inhalation capsule Generic drug:  tiotropium   tamsulosin 0.4 MG Caps capsule Commonly known as:  FLOMAX Take 1 capsule (0.4 mg total) by mouth daily.   vitamin B-12 1000 MCG tablet Commonly known as:  CYANOCOBALAMIN Take 1,000 mcg by mouth daily.       Allergies: No Known Allergies  Family History: Family History  Problem Relation Age of Onset  . Heart disease Mother   . Heart attack Mother 2  . Heart disease Father   . Heart attack Father 39  . Heart disease Brother   . Heart attack Brother 38    Social History:  reports that he quit smoking about 10 years ago. His smoking use included cigarettes. He has a 40.00 pack-year smoking history. he has never used smokeless tobacco. He reports that he does not drink alcohol or use drugs.  ROS: UROLOGY Frequent Urination?: No Hard to postpone urination?: No Burning/pain with urination?: No Get up at night to urinate?: No Leakage of urine?: Yes Urine stream starts and stops?: No Trouble starting stream?: No Do you have to strain to urinate?: No Blood in urine?: No Urinary tract infection?: No Sexually transmitted disease?: No Injury to kidneys or bladder?: No Painful intercourse?: No Weak stream?: No Erection problems?: No Penile pain?: No  Gastrointestinal Nausea?: No Vomiting?: No Indigestion/heartburn?: No Diarrhea?: No Constipation?: No  Constitutional Fever: No Night sweats?: No Weight loss?: No Fatigue?: No  Skin Skin rash/lesions?: No Itching?: No  Eyes Blurred vision?: No Double vision?: No  Ears/Nose/Throat Sore throat?: No Sinus problems?: No  Hematologic/Lymphatic Swollen glands?: No Easy bruising?: No  Cardiovascular Leg swelling?: No Chest pain?:  No  Respiratory Cough?: No Shortness of breath?: No  Endocrine Excessive thirst?: No  Musculoskeletal Back pain?: No Joint pain?: No  Neurological Headaches?: No Dizziness?: No  Psychologic Depression?: No Anxiety?: No  Physical Exam: BP (!) 156/84 (BP Location: Right Arm, Patient Position: Sitting, Cuff Size: Large)   Pulse 66   Ht 5\' 7"  (1.702 m)   Wt 251 lb 12.8 oz (114.2 kg)   BMI 39.44 kg/m   Constitutional:  Alert and oriented, No acute distress. HEENT: [place] AT, moist mucus membranes.  Trachea midline, no masses. Cardiovascular: No clubbing, cyanosis, or edema. Respiratory: Normal respiratory effort, no increased work of breathing. GI: Abdomen is soft, nontender, nondistended, no abdominal masses GU: No CVA tenderness.  Skin: No rashes, bruises or suspicious lesions. Lymph: No cervical or inguinal adenopathy.  Neurologic: Grossly intact, no focal deficits, moving all 4 extremities. Psychiatric: Normal mood and affect.  Laboratory Data: Lab Results  Component Value Date   WBC 9.2 04/03/2015   HGB 12.6 (L) 04/03/2015   HCT 36.7 (L) 04/03/2015   MCV 89.1 04/03/2015   PLT 197 04/03/2015    Lab Results  Component Value Date   CREATININE 0.80 05/13/2016    No results found for: PSA  No results found for: TESTOSTERONE  No results found for: HGBA1C  Urinalysis No results found for: COLORURINE, APPEARANCEUR, LABSPEC, PHURINE, GLUCOSEU, HGBUR, BILIRUBINUR, KETONESUR, PROTEINUR, UROBILINOGEN, NITRITE, LEUKOCYTESUR   Assessment & Plan:    1. BPH -symptoms well controlled -continue flomax  2. Auto inflation of penile prosthesis Remains uninterested in removal or replacement of this device as he is not currently bothered by it.   Return in about 1 year (around 03/17/2018).  Nickie Retort, MD  [facility] [address], [place], [place] [address] [phone]

## 2017-03-19 NOTE — Progress Notes (Signed)
Cardiology Office Note  Date:  03/21/2017   ID:  Mario [facility], DOB April 16, 1945, [name]  PCP:  Mario Hartigan, MD   Chief Complaint  Patient presents with  . other    6 month follow up. Meds reviewed by the pt. verbally. "doing well. "     HPI:  Mario Ward is a 72 year old gentleman with a history of  Bariatric surgery 2016 coronary artery disease, bypass x4 on 02/08/1995,  diabetes,  smoking for 40 years stopped 7 years ago,  obesity,  obstructive sleep apnea who is compliant with his CPAP,  hyperlipidemia  Last cardiac catheterization April 2015 4.8 cm AAA, had a stent placed by [facility] March 2017  diagnosis of well-differentiated neuroendocrine tumor, carcinoid tumor, followed by [facility] doctor [facility] who presents for routine follow up of his coronary artery disease/CABG and PAD  Starting pulmonary rehab later this month Denies any chest pain with exertion Weight has been trending upwards  Previous office visit with body ache felt it was from his medications,  Tolerating simvastatin 40 mg daily Did not feel that Zetia was making much difference and stopped this repeat lipid panel has not been performed yet On the Zetia Previously LDL at goal 55  Having sinus problems, seen by ENT Recommended to have sinus procedure It was mentioned to him that metoprolol might cause dry sinuses and he should change the medication  Total chol 143, ldl 81 He does not feel at the [place] made much of a difference  He has periodic follow-up with Dr. Raul Ward for pulmonary issues Uses inhaler PRN, not on a regular basis  Reports he is scheduled to see Dr. Francella [facility] at [facility],  annual EGD Last Procedure 04/2016 Scheduled for repeat procedure March 2019  EKG on today's visit shows normal sinus rhythm with rate 66 bpm with no significant ST or T-wave changes  Other past medical history reviewed On 06/24/2014 he Had bariatric surgery, performed by dr. Darnell Ward Still on cpap because  he sleeps better with the machine   total cholesterol February 2017 was 169, LDL 96 Follow-up LDL of 80    upper endoscopy at [facility], diagnosis of well-differentiated neuroendocrine tumor, carcinoid tumor  prior cardiac catheterization in April 2015.  05/02/2013, He developed chest discomfort, malaise, did not feel well. His wife called 64 and he was initially taken to a [facility] in [facility], and transferred to [facility] where he underwent cardiac catheterization  Cardiac catheterization showed severe three-vessel coronary artery disease. He had an occluded RCA, occluded mid LAD, occluded marginal branch off the circumflex,  He had a vein graft to the diagonal, vein graft to the OM, occluded vein graft to the RCA, patent LIMA to the LAD. Medical management was recommended Ejection fraction estimated at 55%, normal wall motion.   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  PSH:    Past Surgical History:  Procedure Laterality Date  . BARIATRIC SURGERY    . CARDIAC CATHETERIZATION  05/03/2013   [facility]  . CHOLECYSTECTOMY    . CORONARY ARTERY BYPASS GRAFT  1997   CABG x 4 in [place]   . EYE SURGERY Bilateral  Cataract Extraction with IOL  . JOINT REPLACEMENT Right    total knee replacement  . PENILE PROSTHESIS IMPLANT    . PERIPHERAL VASCULAR CATHETERIZATION N/A 04/02/2015   Procedure: Endovascular Repair/Stent Graft;  Surgeon: Mario Cabal, MD;  Location: [facility];  Service: Cardiovascular;  Laterality: N/A;  . REPLACEMENT TOTAL KNEE     right     Current Outpatient Medications  Medication Sig Dispense Refill  . albuterol (PROVENTIL) (2.5 MG/3ML) 0.083% nebulizer solution Take 2.5 mg by nebulization every 2 (two) hours as needed for wheezing or shortness of breath.    Marland Kitchen aspirin EC 81 MG tablet Take 81 mg by mouth daily.    . cholecalciferol (VITAMIN D) 1000 UNITS tablet Take 2,000 Units by mouth daily.    . clopidogrel (PLAVIX) 75 MG tablet Take 75 mg by mouth daily.    Marland Kitchen lisinopril (PRINIVIL,ZESTRIL) 40 MG tablet Take 40 mg by mouth daily.    . metoprolol tartrate (LOPRESSOR) 25 MG tablet Take 25 mg by mouth 2 (two) times daily.     . Multiple Vitamin (MULTIVITAMIN) tablet Take 1 tablet by mouth daily.    . nitroGLYCERIN (NITROSTAT) 0.4 MG SL tablet Place 0.4 mg under the tongue every 5 (five) minutes as needed for chest pain.    Marland Kitchen Olopatadine HCl (PATADAY) 0.2 % SOLN Apply 2 drops to eye as needed.     . RABEprazole (ACIPHEX) 20 MG tablet TAKE 1 TABLET DAILY 45 MINUTES BEFORE BREAKFAST ON AN EMPTY STOMACH    . simvastatin (ZOCOR) 40 MG tablet TAKE 1 TABLET DAILY 90 tablet 3  . SPIRIVA HANDIHALER 18 MCG inhalation capsule     . tamsulosin (FLOMAX) 0.4 MG CAPS capsule Take 1 capsule (0.4 mg total) by mouth daily. 90 capsule 3  . vitamin B-12 (CYANOCOBALAMIN) 1000 MCG tablet Take 1,000 mcg by mouth daily.     No current facility-administered medications for this visit.      Allergies:   Patient has no known allergies.   Social History:  The patient  reports that he quit smoking about 10 years ago. His smoking use included cigarettes. He has a 40.00 pack-year smoking history. he has never used smokeless tobacco. He reports that he does not drink alcohol or use drugs.   Family History:   family history includes Heart attack (age of onset: 33) in his brother; Heart attack (age of onset: 48) in his father; Heart attack (age of onset: 89) in his mother; Heart disease in his brother, father, and mother.    Review of Systems: Review  of Systems  Constitutional: Negative.   HENT: Positive for congestion.   Respiratory: Positive for shortness of breath.   Cardiovascular: Negative.   Gastrointestinal: Negative.   Musculoskeletal: Negative.   Neurological: Negative.   Psychiatric/Behavioral: Negative.   All other systems reviewed and are negative.    PHYSICAL EXAM: VS:  BP 138/80 (BP Location: Left Arm, Patient Position: Sitting, Cuff Size: Normal)   Pulse 66   Ht 5\' 7"  (1.702 m)   Wt 250 lb 4 oz (113.5 kg)   BMI 39.19 kg/m  , BMI Body mass index is 39.19 kg/m. Constitutional:  oriented to person, place, and time. No distress. Obese HENT:  Head: Normocephalic and atraumatic.  Eyes:  no discharge. No scleral icterus.  Neck: Normal range of motion. Neck supple. No JVD present.  Cardiovascular: Normal rate, regular rhythm, normal heart sounds and intact distal pulses. Exam reveals no gallop and no friction  rub. No edema No murmur heard. Pulmonary/Chest: Effort normal and breath sounds normal. No stridor. No respiratory distress.  no wheezes.  no rales.  no tenderness.  Abdominal: Soft.  no distension.  no tenderness.  Musculoskeletal: Normal range of motion.  no  tenderness or deformity.  Neurological:  normal muscle tone. Coordination normal. No atrophy Skin: Skin is warm and dry. No rash noted. not diaphoretic.  Psychiatric:  normal mood and affect. behavior is normal. Thought content normal.     Recent Labs: 05/13/2016: Creatinine, Ser 0.80    Lipid Panel No results found for: CHOL, HDL, LDLCALC, TRIG    Wt Readings from Last 3 Encounters:  03/21/17 250 lb 4 oz (113.5 kg)  03/17/17 251 lb 12.8 oz (114.2 kg)  03/01/17 247 lb 6.4 oz (112.2 kg)       ASSESSMENT AND PLAN:   Essential hypertension - Blood pressure is well controlled on today's visit. No changes made to the medications. He will talk to ear nose throat concerning any unwanted side effects from the medications on his sinuses changes  can be made if needed.   Hyperlipidemia, unspecified hyperlipidemia type - Plan: EKG 12-Lead Taking simvastatin 40 mg daily Stopped Zetia on his own feeling it was causing side effects Was previously at goal Goal LDL less than 70  Hx of CABG aware of increased risk of bleeding by taking Plavix  does not want to change his medications at this time as he is doing well   OSA on CPAP Tolerating his CPAP, using humidified air we have recommended weight loss  Chronic hypoxemic respiratory failure ([place])  rarely uses inhalers , managed by pulmonary, Dr. Raul Ward  Some wheezing on today's exam  Morbid obesity ([place])  recommended he start regular exercise program Previous bariatric surgery  well-differentiated neuroendocrine tumor, carcinoid tumor, followed by [facility] doctor [facility]  acceptable risk to stop Plavix 5 days before the procedure He has endoscopy on annual basis   Total encounter time more than 25 minutes  Greater than 50% was spent in counseling and coordination of care with the patient   Disposition:   F/U  12 months   No orders of the defined types were placed in this encounter.    Signed, Esmond Plants, M.D., Ph.D. 03/21/2017  [place], [place]

## 2017-03-21 ENCOUNTER — Encounter: Payer: Self-pay | Admitting: *Deleted

## 2017-03-21 ENCOUNTER — Encounter: Payer: Self-pay | Admitting: Cardiovascular Disease

## 2017-03-21 ENCOUNTER — Ambulatory Visit (INDEPENDENT_AMBULATORY_CARE_PROVIDER_SITE_OTHER): Payer: Medicare Other | Admitting: Cardiovascular Disease

## 2017-03-21 VITALS — BP 138/80 | HR 66 | Ht 67.0 in | Wt 250.2 lb

## 2017-03-21 DIAGNOSIS — Z951 Presence of aortocoronary bypass graft: Secondary | ICD-10-CM

## 2017-03-21 DIAGNOSIS — I25118 Atherosclerotic heart disease of native coronary artery with other forms of angina pectoris: Secondary | ICD-10-CM

## 2017-03-21 DIAGNOSIS — E118 Type 2 diabetes mellitus with unspecified complications: Secondary | ICD-10-CM

## 2017-03-21 DIAGNOSIS — G4733 Obstructive sleep apnea (adult) (pediatric): Secondary | ICD-10-CM

## 2017-03-21 DIAGNOSIS — I714 Abdominal aortic aneurysm, without rupture, unspecified: Secondary | ICD-10-CM

## 2017-03-21 DIAGNOSIS — J432 Centrilobular emphysema: Secondary | ICD-10-CM | POA: Diagnosis not present

## 2017-03-21 DIAGNOSIS — R0602 Shortness of breath: Secondary | ICD-10-CM | POA: Diagnosis not present

## 2017-03-21 DIAGNOSIS — Z9989 Dependence on other enabling machines and devices: Secondary | ICD-10-CM | POA: Diagnosis not present

## 2017-03-21 DIAGNOSIS — I10 Essential (primary) hypertension: Secondary | ICD-10-CM | POA: Diagnosis not present

## 2017-03-21 DIAGNOSIS — E782 Mixed hyperlipidemia: Secondary | ICD-10-CM | POA: Diagnosis not present

## 2017-03-21 NOTE — Patient Instructions (Addendum)
Medication Instructions:   No medication changes made  Ask ENT which b-blocker he would prefer? Not aware of nose side effects  Ok to stop plavix 5 days before endoscopy Dr. Francella Solian   04/14/2017  Labwork:  No new labs needed  Testing/Procedures:  No further testing at this time   Follow-Up: It was a pleasure seeing you in the office today. Please call us if you have new issues that need to be addressed before your next appt.  [phone]  Your physician wants you to follow-up in: 12 months.  You will receive a reminder letter in the mail two months in advance. If you don't receive a letter, please call our office to schedule the follow-up appointment.  If you need a refill on your cardiac medications before your next appointment, please call your pharmacy.

## 2017-03-25 NOTE — Addendum Note (Signed)
Addended by: Anselm Pancoast on: 03/25/2017 08:49 AM   Modules accepted: Orders

## 2017-03-28 DIAGNOSIS — J449 Chronic obstructive pulmonary disease, unspecified: Secondary | ICD-10-CM

## 2017-03-28 LAB — GLUCOSE, CAPILLARY: GLUCOSE-CAPILLARY: 128 mg/dL — AB (ref 65–99)

## 2017-03-28 NOTE — Progress Notes (Signed)
Daily Session Note  Patient Details  Name: [facility] MRN: [id number] Date of Birth: 01/14/1946 Referring Provider:     Pulmonary Rehab from 03/01/2017 in [facility]  Referring Provider  Raul Del      Encounter Date: 03/28/2017  Check In: Session Check In - 03/28/17 1148      Check-In   Location  [facility]    Staff Present  Earlean Shawl, BS, ACSM CEP, Exercise Physiologist;Amanda Oletta Darter, BA, ACSM CEP, Exercise Physiologist;Joseph Flavia Shipper    Supervising physician immediately available to respond to emergencies  [facility] immediately available ER MD    Physician(s)  Dr. Reita Cliche and Mariea Clonts    Medication changes reported      No    Fall or balance concerns reported     No    Tobacco Cessation  No Change    Warm-up and Cool-down  Performed as group-led instruction    Resistance Training Performed  Yes    VAD Patient?  No      Pain Assessment   Currently in Pain?  No/denies          Social History   Tobacco Use  Smoking Status Former Smoker  . Packs/day: 1.00  . Years: 40.00  . Pack years: 40.00  . Types: Cigarettes  . Last attempt to quit: 09/07/2006  . Years since quitting: 10.5  Smokeless Tobacco Never Used    Goals Met:  Exercise tolerated well Personal goals reviewed Queuing for purse lip breathing No report of cardiac concerns or symptoms Strength training completed today  Goals Unmet:  Not Applicable  Comments: First full day of exercise!  Patient was oriented to gym and equipment including functions, settings, policies, and procedures.  Patient's individual exercise prescription and treatment plan were reviewed.  All starting workloads were established based on the results of the 6 minute walk test done at initial orientation visit.  The plan for exercise progression was also introduced and progression will be customized based on patient's performance and goals.   Dr. Emily Filbert is Medical Director for  [facility] and [facility].

## 2017-03-30 DIAGNOSIS — J449 Chronic obstructive pulmonary disease, unspecified: Secondary | ICD-10-CM

## 2017-03-30 LAB — GLUCOSE, CAPILLARY
Glucose-Capillary: 144 mg/dL — ABNORMAL HIGH (ref 65–99)
Glucose-Capillary: 126 mg/dL — ABNORMAL HIGH (ref 65–99)

## 2017-03-30 NOTE — Progress Notes (Signed)
Daily Session Note  Patient Details  Name: Mario Ward MRN: [id number] Date of Birth: 08-23-1945 Referring Provider:     Pulmonary Rehab from 03/01/2017 in [facility] and Pul[facility] Rehab  Referring Provider  Raul Del      Encounter Date: 03/30/2017  Check In: Session Check In - 03/30/17 1135      Check-In   Location  [facility]-Cardiac & Pulmonary Rehab    Staff Present  Justin Mend RCP,RRT,BSRT;Meredith Sherryll Burger, RN BSN;Jessica Luan Pulling, MA, RCEP, CCRP, Exercise Physiologist    Supervising physician immediately available to respond to emergencies  [facility] immediately available ER MD    Physician(s)  Dr. Reita Cliche and Jimmye Norman    Medication changes reported      No    Fall or balance concerns reported     No    Tobacco Cessation  No Change    Warm-up and Cool-down  Performed as group-led instruction    Resistance Training Performed  Yes    VAD Patient?  No      Pain Assessment   Currently in Pain?  No/denies          Social History   Tobacco Use  Smoking Status Former Smoker  . Packs/day: 1.00  . Years: 40.00  . Pack years: 40.00  . Types: Cigarettes  . Last attempt to quit: 09/07/2006  . Years since quitting: 10.5  Smokeless Tobacco Never Used    Goals Met:  Independence with exercise equipment Exercise tolerated well No report of cardiac concerns or symptoms Strength training completed today  Goals Unmet:  Not Applicable  Comments: Pt able to follow exercise prescription today without complaint.  Will continue to monitor for progression.   Dr. Emily Filbert is Medical Director for [place] and [facility].

## 2017-04-01 ENCOUNTER — Encounter: Payer: Medicare Other | Attending: Specialist

## 2017-04-01 DIAGNOSIS — J449 Chronic obstructive pulmonary disease, unspecified: Secondary | ICD-10-CM | POA: Insufficient documentation

## 2017-04-04 ENCOUNTER — Encounter: Payer: Medicare Other | Admitting: *Deleted

## 2017-04-04 DIAGNOSIS — J449 Chronic obstructive pulmonary disease, unspecified: Secondary | ICD-10-CM

## 2017-04-04 LAB — GLUCOSE, CAPILLARY
GLUCOSE-CAPILLARY: 143 mg/dL — AB (ref 65–99)
GLUCOSE-CAPILLARY: 116 mg/dL — AB (ref 65–99)

## 2017-04-04 NOTE — Progress Notes (Signed)
Daily Session Note  Patient Details  Name: Mario Ward MRN: [id number] Date of Birth: [birth year] Referring Provider:     Pulmonary Rehab from 03/01/2017 in [facility] Card[facility] and Pulmonary Rehab  Referring Provider  Raul Del      Encounter Date: 04/04/2017  Check In: Session Check In - 04/04/17 1143      Check-In   Location  [facility]    Staff Present  Earlean Shawl, BS, ACSM CEP, Exercise Physiologist;Amanda Oletta Darter, BA, ACSM CEP, Exercise Physiologist;Joseph Flavia Shipper    Supervising physician immediately available to respond to emergencies  [facility] immediately available ER MD    Physician(s)  Drs. McShane and Williams    Medication changes reported      No    Fall or balance concerns reported     No    Warm-up and Cool-down  Performed as group-led Higher education careers adviser Performed  Yes    VAD Patient?  No      Pain Assessment   Currently in Pain?  No/denies          Social History   Tobacco Use  Smoking Status Former Smoker  . Packs/day: 1.00  . Years: 40.00  . Pack years: 40.00  . Types: Cigarettes  . Last attempt to quit: 09/07/2006  . Years since quitting: 10.5  Smokeless Tobacco Never Used    Goals Met:  Proper associated with RPD/PD & O2 Sat Independence with exercise equipment Using PLB without cueing & demonstrates good technique Exercise tolerated well No report of cardiac concerns or symptoms Strength training completed today  Goals Unmet:  Not Applicable  Comments: Pt able to follow exercise prescription today without complaint.  Will continue to monitor for progression. Reviewed home exercise with pt today.  Pt plans to walking and using treadmill and recumbent bike at home for exercise.  Reviewed THR, pulse, RPE, sign and symptoms, and when to call 911 or MD.  Also discussed weather considerations and indoor options.  Pt voiced understanding.    Dr. Emily Filbert is Medical Director for [place] and [facility].

## 2017-04-05 ENCOUNTER — Encounter: Payer: Self-pay | Admitting: Dietician

## 2017-04-06 ENCOUNTER — Encounter: Payer: Medicare Other | Admitting: *Deleted

## 2017-04-06 DIAGNOSIS — J449 Chronic obstructive pulmonary disease, unspecified: Secondary | ICD-10-CM | POA: Diagnosis not present

## 2017-04-06 LAB — GLUCOSE, CAPILLARY
GLUCOSE-CAPILLARY: 122 mg/dL — AB (ref 65–99)
Glucose-Capillary: 127 mg/dL — ABNORMAL HIGH (ref 65–99)

## 2017-04-06 NOTE — Progress Notes (Signed)
Daily Session Note  Patient Details  Name: Mario Ward MRN: [id number] Date of Birth: 1945-09-12 Referring Provider:     Pulmonary Rehab from 03/01/2017 in [facility] Cardiac and Pulmonary Rehab  Referring Provider  Raul Del      Encounter Date: 04/06/2017  Check In: Session Check In - 04/06/17 1132      Check-In   Location  [facility] Pul[facility] Re[facility]    Staff Present  Renita Papa, RN BSN;Joseph Darrin Nipper, [place], RCEP, [place], Exercise Physiologist    Supervising physician immediately available to respond to emergencies  [facility] immediately available ER MD    Physician(s)  Dr. Joni Fears and Jimmye Norman     Medication changes reported      No    Fall or balance concerns reported     No    Warm-up and Cool-down  Performed as group-led instruction    Resistance Training Performed  Yes    VAD Patient?  No      Pain Assessment   Currently in Pain?  No/denies          Social History   Tobacco Use  Smoking Status Former Smoker  . Packs/day: 1.00  . Years: 40.00  . Pack years: 40.00  . Types: Cigarettes  . Last attempt to quit: 09/07/2006  . Years since quitting: 10.5  Smokeless Tobacco Never Used    Goals Met:  Proper associated with RPD/PD & O2 Sat Independence with exercise equipment Using PLB without cueing & demonstrates good technique Exercise tolerated well Strength training completed today  Goals Unmet:  Not Applicable  Comments: Pt able to follow exercise prescription today without complaint.  Will continue to monitor for progression.    Dr. Emily Filbert is Medical Director for [place] and [facility].

## 2017-04-08 ENCOUNTER — Encounter: Payer: Medicare Other | Admitting: *Deleted

## 2017-04-08 DIAGNOSIS — J449 Chronic obstructive pulmonary disease, unspecified: Secondary | ICD-10-CM | POA: Diagnosis not present

## 2017-04-08 NOTE — Progress Notes (Signed)
Daily Session Note  Patient Details  Name: Mario Ward MRN: [id number] Date of Birth: [birth year] Referring Provider:     Pulmonary Rehab from 03/01/2017 in [facility] Cardiac and Pulmonary Rehab  Referring Provider  Raul Del      Encounter Date: 04/08/2017  Check In: Session Check In - 04/08/17 1228      Check-In   Location  [facility]    Staff Present  Renita Papa, RN BSN;Mary Kellie Shropshire, RN, BSN, Willette Pa, MA, RCEP, CCRP, Exercise Physiologist    Supervising physician immediately available to respond to emergencies  [facility] immediately available ER MD    Physician(s)   Dr. Reita Cliche and Archie Balboa    Medication changes reported      No    Fall or balance concerns reported     No    Warm-up and Cool-down  Performed as group-led instruction    Resistance Training Performed  Yes    VAD Patient?  No      Pain Assessment   Currently in Pain?  No/denies          Social History   Tobacco Use  Smoking Status Former Smoker  . Packs/day: 1.00  . Years: 40.00  . Pack years: 40.00  . Types: Cigarettes  . Last attempt to quit: 09/07/2006  . Years since quitting: 10.5  Smokeless Tobacco Never Used    Goals Met:  Proper associated with RPD/PD & O2 Sat Independence with exercise equipment Using PLB without cueing & demonstrates good technique Exercise tolerated well Strength training completed today  Goals Unmet:  Not Applicable  Comments: Pt able to follow exercise prescription today without complaint.  Will continue to monitor for progression.    Dr. Emily Filbert is Medical Director for [place] and [facility].

## 2017-04-11 DIAGNOSIS — J449 Chronic obstructive pulmonary disease, unspecified: Secondary | ICD-10-CM | POA: Diagnosis not present

## 2017-04-11 NOTE — Progress Notes (Signed)
Pulmonary Individual Treatment Plan  Patient Details  Name: Mario Ward MRN: [id number] Date of Birth: [birth year] Referring Provider:     Pulmonary Rehab from 03/01/2017 in [facility]  Referring Provider  Raul Del      Initial Encounter Date:    Pulmonary Rehab from 03/01/2017 in [facility] Cardiac and Pulmonary Rehab  Date  03/01/17  Referring Provider  Raul Del      Visit Diagnosis: Chronic obstructive pulmonary disease, unspecified COPD type ([place])  Patient's Home Medications on Admission:  Current Outpatient Medications:  .  albuterol (PROVENTIL) (2.5 MG/3ML) 0.083% nebulizer solution, Take 2.5 mg by nebulization every 2 (two) hours as needed for wheezing or shortness of breath., Disp: , Rfl:  .  aspirin EC 81 MG tablet, Take 81 mg by mouth daily., Disp: , Rfl:  .  cholecalciferol (VITAMIN D) 1000 UNITS tablet, Take 2,000 Units by mouth daily., Disp: , Rfl:  .  clopidogrel (PLAVIX) 75 MG tablet, Take 75 mg by mouth daily., Disp: , Rfl:  .  lisinopril (PRINIVIL,ZESTRIL) 40 MG tablet, Take 40 mg by mouth daily., Disp: , Rfl:  .  metoprolol tartrate (LOPRESSOR) 25 MG tablet, Take 25 mg by mouth 2 (two) times daily. , Disp: , Rfl:  .  Multiple Vitamin (MULTIVITAMIN) tablet, Take 1 tablet by mouth daily., Disp: , Rfl:  .  nitroGLYCERIN (NITROSTAT) 0.4 MG SL tablet, Place 0.4 mg under the tongue every 5 (five) minutes as needed for chest pain., Disp: , Rfl:  .  Olopatadine HCl (PATADAY) 0.2 % SOLN, Apply 2 drops to eye as needed. , Disp: , Rfl:  .  RABEprazole (ACIPHEX) 20 MG tablet, TAKE 1 TABLET DAILY 45 MINUTES BEFORE BREAKFAST ON AN EMPTY STOMACH, Disp: , Rfl:  .  simvastatin (ZOCOR) 40 MG tablet, TAKE 1 TABLET DAILY, Disp: 90 tablet, Rfl: 3 .  SPIRIVA HANDIHALER 18 MCG inhalation capsule, , Disp: , Rfl:  .  tamsulosin (FLOMAX) 0.4 MG CAPS capsule, Take 1 capsule (0.4 mg total) by mouth daily., Disp: 90 capsule, Rfl: 3 .  vitamin B-12 (CYANOCOBALAMIN) 1000 MCG  tablet, Take 1,000 mcg by mouth daily., Disp: , Rfl:   Past Medical History: Past Medical History:  Diagnosis Date  . AAA (abdominal aortic aneurysm) ([place])   . Anginal pain ([place])   . Arthritis   . COPD (chronic obstructive pulmonary disease) ([place])   . Coronary artery disease 1997   CABG x 4   . 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  . 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 GERD (gastroesophageal reflux disease)   . Hypertension   . Sleep apnea    uses a CPAP at night.   . Vitamin D deficiency     Tobacco Use: Social History   Tobacco Use  Smoking Status Former Smoker  . Packs/day: 1.00  . Years: 40.00  . Pack years: 40.00  . Types: Cigarettes  . Last attempt to quit: 09/07/2006  . Years since quitting: 10.6  Smokeless Tobacco Never Used    Labs: Recent Review Flowsheet Data    There is no flowsheet data to display.       Pulmonary Assessment Scores: Pulmonary Assessment Scores    Row Name 03/01/17 1115         ADL UCSD   ADL Phase  Entry     SOB Score total  31     Rest  0  Walk  2     Stairs  3     Bath  2     Dress  2     Shop  3       CAT Score   CAT Score  12       mMRC Score   mMRC Score  1        Pulmonary Function Assessment: Pulmonary Function Assessment - 03/01/17 1133      Initial Spirometry Results   FVC%  57 %    FEV1%  57 %    FEV1/FVC Ratio  72.66    Comments  good patient effort      Post Bronchodilator Spirometry Results   FVC%  61.56 %    FEV1%  61.56 %    FEV1/FVC Ratio  72.79    Comments  good patient effort      Breath   Bilateral Breath Sounds  Clear    Shortness of Breath  No       Exercise Target Goals:    Exercise Program Goal: Individual exercise prescription set using results from initial 6 min walk test and THRR while considering  patient's activity barriers and safety.    Exercise Prescription Goal: Initial exercise prescription  builds to 30-45 minutes a day of aerobic activity, 2-3 days per week.  Home exercise guidelines will be given to patient during program as part of exercise prescription that the participant will acknowledge.  Activity Barriers & Risk Stratification:   6 Minute Walk: 6 Minute Walk    Row Name 03/01/17 1300 03/01/17 1304       6 Minute Walk   Distance  1200 feet  -    Walk Time  6 minutes  -    # of Rest Breaks  0  -    MPH  2.27  -    METS  2.29  -    RPE  11  -    Perceived Dyspnea   2  -    VO2 Peak  8.01  -    Symptoms  No  -    Resting HR  66 bpm  -    Resting BP  136/76  -    Resting Oxygen Saturation   94 %  -    Exercise Oxygen Saturation  during 6 min walk  88 %  -    Max Ex. HR  95 bpm  -    Max Ex. BP  192/78  -    2 Minute Post BP  166/82  -      Interval HR   1 Minute HR  -  82    2 Minute HR  -  84    3 Minute HR  -  86    4 Minute HR  -  85    6 Minute HR  -  95    2 Minute Post HR  -  69    Interval Heart Rate?  -  Yes      Interval Oxygen   Interval Oxygen?  -  Yes    Baseline Oxygen Saturation %  -  94 %    1 Minute Oxygen Saturation %  -  95 %    1 Minute Liters of Oxygen  -  0 L    2 Minute Oxygen Saturation %  -  89 %    2 Minute Liters of Oxygen  -  0 L  3 Minute Oxygen Saturation %  -  88 %    3 Minute Liters of Oxygen  -  0 L    4 Minute Oxygen Saturation %  -  92 %    4 Minute Liters of Oxygen  -  0 L    5 Minute Liters of Oxygen  -  0 L    6 Minute Oxygen Saturation %  -  95 %    6 Minute Liters of Oxygen  -  0 L    2 Minute Post Oxygen Saturation %  -  97 %    2 Minute Post Liters of Oxygen  -  0 L      Oxygen Initial Assessment: Oxygen Initial Assessment - 03/01/17 1130      Home Oxygen   Home Oxygen Device  None    Sleep Oxygen Prescription  CPAP    Liters per minute  0    Home Exercise Oxygen Prescription  None    Home at Rest Exercise Oxygen Prescription  None    Compliance with Home Oxygen Use  Yes      Initial 6 min  Walk   Oxygen Used  None      Program Oxygen Prescription   Program Oxygen Prescription  None      Intervention   Short Term Goals  To learn and demonstrate proper use of respiratory medications;To learn and demonstrate proper pursed lip breathing techniques or other breathing techniques.;To learn and understand importance of maintaining oxygen saturations>88%;To learn and understand importance of monitoring SPO2 with pulse oximeter and demonstrate accurate use of the pulse oximeter.    Long  Term Goals  Verbalizes importance of monitoring SPO2 with pulse oximeter and return demonstration;Maintenance of O2 saturations>88%;Compliance with respiratory medication;Exhibits proper breathing techniques, such as pursed lip breathing or other method taught during program session;Demonstrates proper use of MDI's       Oxygen Re-Evaluation: Oxygen Re-Evaluation    Row Name 03/28/17 1149             Program Oxygen Prescription   Program Oxygen Prescription  None         Home Oxygen   Home Oxygen Device  None       Sleep Oxygen Prescription  CPAP       Liters per minute  0       Home Exercise Oxygen Prescription  None       Home at Rest Exercise Oxygen Prescription  None       Compliance with Home Oxygen Use  Yes         Goals/Expected Outcomes   Short Term Goals  To learn and demonstrate proper use of respiratory medications;To learn and demonstrate proper pursed lip breathing techniques or other breathing techniques.;To learn and understand importance of maintaining oxygen saturations>88%;To learn and understand importance of monitoring SPO2 with pulse oximeter and demonstrate accurate use of the pulse oximeter.       Long  Term Goals  Verbalizes importance of monitoring SPO2 with pulse oximeter and return demonstration;Maintenance of O2 saturations>88%;Compliance with respiratory medication;Exhibits proper breathing techniques, such as pursed lip breathing or other method taught during  program session;Demonstrates proper use of MDI's       Comments  Reviewed PLB technique with pt.  Talked about how it work and it's important to maintaining his exercise saturations.        Goals/Expected Outcomes  Short: Become more profiecient at using PLB.   Long: Become  independent at using PLB.          Oxygen Discharge (Final Oxygen Re-Evaluation): Oxygen Re-Evaluation - 03/28/17 1149      Program Oxygen Prescription   Program Oxygen Prescription  None      Home Oxygen   Home Oxygen Device  None    Sleep Oxygen Prescription  CPAP    Liters per minute  0    Home Exercise Oxygen Prescription  None    Home at Rest Exercise Oxygen Prescription  None    Compliance with Home Oxygen Use  Yes      Goals/Expected Outcomes   Short Term Goals  To learn and demonstrate proper use of respiratory medications;To learn and demonstrate proper pursed lip breathing techniques or other breathing techniques.;To learn and understand importance of maintaining oxygen saturations>88%;To learn and understand importance of monitoring SPO2 with pulse oximeter and demonstrate accurate use of the pulse oximeter.    Long  Term Goals  Verbalizes importance of monitoring SPO2 with pulse oximeter and return demonstration;Maintenance of O2 saturations>88%;Compliance with respiratory medication;Exhibits proper breathing techniques, such as pursed lip breathing or other method taught during program session;Demonstrates proper use of MDI's    Comments  Reviewed PLB technique with pt.  Talked about how it work and it's important to maintaining his exercise saturations.     Goals/Expected Outcomes  Short: Become more profiecient at using PLB.   Long: Become independent at using PLB.       Initial Exercise Prescription: Initial Exercise Prescription - 03/01/17 1300      Date of Initial Exercise RX and Referring Provider   Date  03/01/17    Referring Provider  Raul Del      Treadmill   MPH  2    Grade  0     Minutes  15    METs  2.5      NuStep   Level  2    SPM  80    Minutes  15    METs  2.2      REL-XR   Level  2    Speed  50    Minutes  15    METs  2.2      Biostep-RELP   Level  3    SPM  50    Minutes  15    METs  2      Prescription Details   Frequency (times per week)  3    Duration  Progress to 45 minutes of aerobic exercise without signs/symptoms of physical distress      Intensity   THRR 40-80% of Max Heartrate  98-130    Ratings of Perceived Exertion  11-13    Perceived Dyspnea  0-4      Resistance Training   Training Prescription  Yes    Weight  3 lb    Reps  10-15       Perform Capillary Blood Glucose checks as needed.  Exercise Prescription Changes: Exercise Prescription Changes    Row Name 03/28/17 1600 04/04/17 1200           Response to Exercise   Blood Pressure (Admit)  142/86  -      Blood Pressure (Exercise)  152/74  -      Blood Pressure (Exit)  134/68  -      Heart Rate (Admit)  72 bpm  -      Heart Rate (Exercise)  96 bpm  -  Heart Rate (Exit)  93 bpm  -      Oxygen Saturation (Admit)  94 %  -      Oxygen Saturation (Exercise)  92 %  -      Oxygen Saturation (Exit)  94 %  -      Rating of Perceived Exertion (Exercise)  13  -      Perceived Dyspnea (Exercise)  2  -      Symptoms  -  -      Comments  First full day of exercise  -      Duration  Continue with 45 min of aerobic exercise without signs/symptoms of physical distress.  -      Intensity  THRR unchanged  -        Progression   Progression  Continue to progress workloads to maintain intensity without signs/symptoms of physical distress.  -      Average METs  2.51  -        Resistance Training   Training Prescription  Yes  -      Weight  3 lb  -      Reps  10-15  -        Interval Training   Interval Training  No  -        Oxygen   Oxygen  -  -      Liters  -  -        Treadmill   MPH  2  -      Grade  0  -      Minutes  15  -      METs  2.53  -         NuStep   Level  2  -      SPM  94  -      Minutes  15  -      METs  2.6  -        Recumbant Elliptical   Level  -  -      RPM  -  -      Minutes  -  -      METs  -  -        REL-XR   Level  2  -      Speed  52  -      Minutes  15  -      METs  2.4  -        T5 Nustep   Level  -  -      SPM  -  -      Minutes  -  -      METs  -  -        Home Exercise Plan   Plans to continue exercise at  -  Home (comment) walking, treadmill, recumbent bike      Frequency  -  Add 2 additional days to program exercise sessions.      Initial Home Exercises Provided  -  04/04/17         Exercise Comments: Exercise Comments    Row Name 03/28/17 1149           Exercise Comments  First full day of exercise!  Patient was oriented to gym and equipment including functions, settings, policies, and procedures.  Patient's individual exercise prescription and treatment plan were reviewed.  All starting workloads were established based on the  results of the 6 minute walk test done at initial orientation visit.  The plan for exercise progression was also introduced and progression will be customized based on patient's performance and goals.          Exercise Goals and Review: Exercise Goals    Row Name 03/01/17 1259             Exercise Goals   Increase Physical Activity  Yes       Intervention  Provide advice, education, support and counseling about physical activity/exercise needs.;Develop an individualized exercise prescription for aerobic and resistive training based on initial evaluation findings, risk stratification, comorbidities and participant's personal goals.       Expected Outcomes  Short Term: Attend rehab on a regular basis to increase amount of physical activity.;Long Term: Add in home exercise to make exercise part of routine and to increase amount of physical activity.;Long Term: Exercising regularly at least 3-5 days a week.       Increase Strength and Stamina  Yes        Intervention  Provide advice, education, support and counseling about physical activity/exercise needs.;Develop an individualized exercise prescription for aerobic and resistive training based on initial evaluation findings, risk stratification, comorbidities and participant's personal goals.       Expected Outcomes  Short Term: Increase workloads from initial exercise prescription for resistance, speed, and METs.;Short Term: Perform resistance training exercises routinely during rehab and add in resistance training at home;Long Term: Improve cardiorespiratory fitness, muscular endurance and strength as measured by increased METs and functional capacity (6MWT)       Able to understand and use rate of perceived exertion (RPE) scale  Yes       Intervention  Provide education and explanation on how to use RPE scale       Expected Outcomes  Short Term: Able to use RPE daily in rehab to express subjective intensity level;Long Term:  Able to use RPE to guide intensity level when exercising independently       Able to understand and use Dyspnea scale  Yes       Intervention  Provide education and explanation on how to use Dyspnea scale       Expected Outcomes  Short Term: Able to use Dyspnea scale daily in rehab to express subjective sense of shortness of breath during exertion;Long Term: Able to use Dyspnea scale to guide intensity level when exercising independently       Knowledge and understanding of Target Heart Rate Range (THRR)  Yes       Intervention  Provide education and explanation of THRR including how the numbers were predicted and where they are located for reference       Expected Outcomes  Short Term: Able to state/look up THRR;Long Term: Able to use THRR to govern intensity when exercising independently;Short Term: Able to use daily as guideline for intensity in rehab       Able to check pulse independently  Yes       Intervention  Provide education and demonstration on how to check pulse in  carotid and radial arteries.;Review the importance of being able to check your own pulse for safety during independent exercise       Expected Outcomes  Short Term: Able to explain why pulse checking is important during independent exercise;Long Term: Able to check pulse independently and accurately       Understanding of Exercise Prescription  Yes       Intervention  Provide education, explanation, and written materials on patient's individual exercise prescription       Expected Outcomes  Short Term: Able to explain program exercise prescription;Long Term: Able to explain home exercise prescription to exercise independently          Exercise Goals Re-Evaluation : Exercise Goals Re-Evaluation    Row Name 03/28/17 1149 04/04/17 1240           Exercise Goal Re-Evaluation   Exercise Goals Review  Understanding of Exercise Prescription;Able to understand and use Dyspnea scale;Knowledge and understanding of Target Heart Rate Range (THRR);Able to understand and use rate of perceived exertion (RPE) scale  Understanding of Exercise Prescription;Able to understand and use Dyspnea scale;Knowledge and understanding of Target Heart Rate Range (THRR);Able to understand and use rate of perceived exertion (RPE) scale;Increase Physical Activity;Able to check pulse independently      Comments  Reviewed RPE scale, THR and program prescription with pt today.  Pt voiced understanding and was given a copy of goals to take home.   Reviewed home exercise with pt today.  Pt plans to walking and using treadmill and recumbent bike at home for exercise.  Reviewed THR, pulse, RPE, sign and symptoms, and when to call 911 or MD.  Also discussed weather considerations and indoor options.  Pt voiced understanding.      Expected Outcomes  Short: Use RPE daily to regulate intensity.  Long: Follow program prescription in THR.  Short: Continue to exercise by adding in one extra day a week for 30 min routinely.  Long: Continue to  exercise regularly to increase strength and stamina.          Discharge Exercise Prescription (Final Exercise Prescription Changes): Exercise Prescription Changes - 04/04/17 1200      Home Exercise Plan   Plans to continue exercise at  Home (comment) walking, treadmill, recumbent bike    Frequency  Add 2 additional days to program exercise sessions.    Initial Home Exercises Provided  04/04/17       Nutrition:  Target Goals: Understanding of nutrition guidelines, daily intake of sodium <1533m, cholesterol <2046m calories 30% from fat and 7% or less from saturated fats, daily to have 5 or more servings of fruits and vegetables.  Biometrics: Pre Biometrics - 03/01/17 1259      Pre Biometrics   Height  '5\' 7"'  (1.702 m)    Weight  247 lb 6.4 oz (112.2 kg)    Waist Circumference  45 inches    Hip Circumference  48 inches    Waist to Hip Ratio  0.94 %    BMI (Calculated)  38.74        Nutrition Therapy Plan and Nutrition Goals: Nutrition Therapy & Goals - 04/05/17 1126      Nutrition Therapy   Diet  bariatric, diabetes    Protein (specify units)  60-80grams    Whole Grain Foods  3 servings    Fruits and Vegetables  5 servings/day      Personal Nutrition Goals   Personal Goal #2  Decrease intake of cheese dips and cheese, as well as potato chips    Personal Goal #3  Look for part-skim mozzarella cheese sticks    Personal Goal #4  Use bariatric menus for balanced meal ideas    Additional Goals?  Yes    Personal Goal #5  Allow up to 45grams carbs with each meal (starches and fruits) -- at least 4 servings of grain foods daily  Comments  Mario Ward has experienced some weight re-gain in recent months after sleeve gastrectomy in 2016. Provided 1500 and 1800kcal bariatric menus for him to resume lower-carb and calorie intake for weight loss. His goal is 210lbs or less.        Nutrition Assessments: Nutrition Assessments - 03/01/17 1113      MEDFICTS Scores   Pre Score  66        Nutrition Goals Re-Evaluation: Nutrition Goals Re-Evaluation    [place] Name 04/04/17 1216             Goals   Nutrition Goal  meet with dietician 3/5       Comment  Discuss weight loss and diet tips       Expected Outcome  Short: Attend appt.  Long: Work on dieting and working on weigh loss and following recommendations.           Nutrition Goals Discharge (Final Nutrition Goals Re-Evaluation): Nutrition Goals Re-Evaluation - 04/04/17 1216      Goals   Nutrition Goal  meet with dietician 3/5    Comment  Discuss weight loss and diet tips    Expected Outcome  Short: Attend appt.  Long: Work on dieting and working on weigh loss and following recommendations.        Psychosocial: Target Goals: Acknowledge presence or absence of significant depression and/or stress, maximize coping skills, provide positive support system. Participant is able to verbalize types and ability to use techniques and skills needed for reducing stress and depression.   Initial Review & Psychosocial Screening: Initial Psych Review & Screening - 03/01/17 1112      Initial Review   Current issues with  None Identified      Family Dynamics   Good Support System?  Yes    Comments  He looks to his wife and kids for support      Barriers   Psychosocial barriers to participate in program  There are no identifiable barriers or psychosocial needs.      Screening Interventions   Interventions  Encouraged to exercise;Program counselor consult;Provide feedback about the scores to participant;To provide support and resources with identified psychosocial needs    Expected Outcomes  Short Term goal: Utilizing psychosocial counselor, staff and physician to assist with identification of specific Stressors or current issues interfering with healing process. Setting desired goal for each stressor or current issue identified.;Long Term Goal: Stressors or current issues are controlled or eliminated.       Quality  of Life Scores:  Scores of 19 and below usually indicate a poorer quality of life in these areas.  A difference of  2-3 points is a clinically meaningful difference.  A difference of 2-3 points in the total score of the Quality of Life Index has been associated with significant improvement in overall quality of life, self-image, physical symptoms, and general health in studies assessing change in quality of life.  PHQ-9: Recent Review Flowsheet Data    Depression screen [facility] 2/9 03/01/2017   Decreased Interest 0   Down, Depressed, Hopeless 0   PHQ - 2 Score 0   Altered sleeping 0   Tired, decreased energy 1   Change in appetite 2   Feeling bad or failure about yourself  0   Trouble concentrating 0   Moving slowly or fidgety/restless 0   Suicidal thoughts 0   PHQ-9 Score 3   Difficult doing work/chores Somewhat difficult     Interpretation of Total Score  Total Score Depression Severity:  1-4 = Minimal depression, 5-9 = Mild depression, 10-14 = Moderate depression, 15-19 = Moderately severe depression, 20-27 = Severe depression   Psychosocial Evaluation and Intervention:   Psychosocial Re-Evaluation: Psychosocial Re-Evaluation    [place] Name 04/04/17 1217             Psychosocial Re-Evaluation   Current issues with  None Identified       Comments  Mario Ward continues to do well mentally.  He has no major stressors in his life and has a strong support system.  He also sleeps well at night as long as he use his CPAP.  He is enjoying the exercise as part of the program.       Expected Outcomes  Short: Continue to attend rehab regualrly.  Long: Continue to maintain postivie attitude.        Interventions  Encouraged to attend Cardiac Rehabilitation for the exercise;Stress management education       Continue Psychosocial Services   Follow up required by staff          Psychosocial Discharge (Final Psychosocial Re-Evaluation): Psychosocial Re-Evaluation - 04/04/17 1217       Psychosocial Re-Evaluation   Current issues with  None Identified    Comments  Mario Ward continues to do well mentally.  He has no major stressors in his life and has a strong support system.  He also sleeps well at night as long as he use his CPAP.  He is enjoying the exercise as part of the program.    Expected Outcomes  Short: Continue to attend rehab regualrly.  Long: Continue to maintain postivie attitude.     Interventions  Encouraged to attend Cardiac Rehabilitation for the exercise;Stress management education    Continue Psychosocial Services   Follow up required by staff       Education: Education Goals: Education classes will be provided on a weekly basis, covering required topics. Participant will state understanding/return demonstration of topics presented.  Learning Barriers/Preferences: Learning Barriers/Preferences - 03/01/17 1117      Learning Barriers/Preferences   Learning Barriers  Sight wears glasses    Learning Preferences  None       Education Topics:  Initial Evaluation Education: - Verbal, written and demonstration of respiratory meds, oximetry and breathing techniques. Instruction on use of nebulizers and MDIs and importance of monitoring MDI activations.   Pulmonary Rehab from 04/06/2017 in [facility] Cardiac and Pulmonary Rehab  Date  03/01/17  Educator  [facility]  Instruction Review Code  1- Verbalizes Understanding      General Nutrition Guidelines/Fats and Fiber: -Group instruction provided by verbal, written material, models and posters to present the general guidelines for heart healthy nutrition. Gives an explanation and review of dietary fats and fiber.   Controlling Sodium/Reading Food Labels: -Group verbal and written material supporting the discussion of sodium use in heart healthy nutrition. Review and explanation with models, verbal and written materials for utilization of the food label.   Exercise Physiology & General Exercise Guidelines: - Group  verbal and written instruction with models to review the exercise physiology of the cardiovascular system and associated critical values. Provides general exercise guidelines with specific guidelines to those with heart or lung disease.    Aerobic Exercise & Resistance Training: - Gives group verbal and written instruction on the various components of exercise. Focuses on aerobic and resistive training programs and the benefits of this training and how to safely progress through these programs.   Flexibility,  Balance, Mind/Body Relaxation: Provides group verbal/written instruction on the benefits of flexibility and balance training, including mind/body exercise modes such as yoga, pilates and tai chi.  Demonstration and skill practice provided.   Stress and Anxiety: - Provides group verbal and written instruction about the health risks of elevated stress and causes of high stress.  Discuss the correlation between heart/lung disease and anxiety and treatment options. Review healthy ways to manage with stress and anxiety.   Pulmonary Rehab from 04/06/2017 in [facility] Cardiac and Pulmonary Rehab  Date  04/06/17  Educator  [facility]  Instruction Review Code  1- Verbalizes Understanding      Depression: - Provides group verbal and written instruction on the correlation between heart/lung disease and depressed mood, treatment options, and the stigmas associated with seeking treatment.   Exercise & Equipment Safety: - Individual verbal instruction and demonstration of equipment use and safety with use of the equipment.   Pulmonary Rehab from 04/06/2017 in [facility] Cardiac and Pulmonary Rehab  Date  03/01/17  Educator  [facility]  Instruction Review Code  1- Verbalizes Understanding      Infection Prevention: - Provides verbal and written material to individual with discussion of infection control including proper hand washing and proper equipment cleaning during exercise session.   Pulmonary Rehab from 04/06/2017 in  [facility] Cardiac and Pulmonary Rehab  Date  03/01/17  Educator  [facility]  Instruction Review Code  1- Verbalizes Understanding      Falls Prevention: - Provides verbal and written material to individual with discussion of falls prevention and safety.   Pulmonary Rehab from 04/06/2017 in [facility] Cardiac and Pulmonary Rehab  Date  03/01/17  Educator  [facility]  Instruction Review Code  1- Verbalizes Understanding      Diabetes: - Individual verbal and written instruction to review signs/symptoms of diabetes, desired ranges of glucose level fasting, after meals and with exercise. Advice that pre and post exercise glucose checks will be done for 3 sessions at entry of program.   Chronic Lung Diseases: - Group verbal and written instruction to review updates, respiratory medications, advancements in procedures and treatments. Discuss use of supplemental oxygen including available portable oxygen systems, continuous and intermittent flow rates, concentrators, personal use and safety guidelines. Review proper use of inhaler and spacers. Provide informative websites for self-education.    Energy Conservation: - Provide group verbal and written instruction for methods to conserve energy, plan and organize activities. Instruct on pacing techniques, use of adaptive equipment and posture/positioning to relieve shortness of breath.   Pulmonary Rehab from 04/06/2017 in [facility] Cardiac and Pulmonary Rehab  Date  03/30/17  Educator  [facility]  Instruction Review Code  1- Verbalizes Understanding      Triggers and Exacerbations: - Group verbal and written instruction to review types of environmental triggers and ways to prevent exacerbations. Discuss weather changes, air quality and the benefits of nasal washing. Review warning signs and symptoms to help prevent infections. Discuss techniques for effective airway clearance, coughing, and vibrations.   AED/CPR: - Group verbal and written instruction with the use of models to  demonstrate the basic use of the AED with the basic ABC's of resuscitation.   Anatomy and Physiology of the Lungs: - Group verbal and written instruction with the use of models to provide basic lung anatomy and physiology related to function, structure and complications of lung disease.   Anatomy & Physiology of the Heart: - Group verbal and written instruction and models provide basic cardiac anatomy and physiology,  with the coronary electrical and arterial systems. Review of Valvular disease and Heart Failure   Cardiac Medications: - Group verbal and written instruction to review commonly prescribed medications for heart disease. Reviews the medication, class of the drug, and side effects.   Know Your Numbers and Risk Factors: -Group verbal and written instruction about important numbers in your health.  Discussion of what are risk factors and how they play a role in the disease process.  Review of Cholesterol, Blood Pressure, Diabetes, and BMI and the role they play in your overall health.   Sleep Hygiene: -Provides group verbal and written instruction about how sleep can affect your health.  Define sleep hygiene, discuss sleep cycles and impact of sleep habits. Review good sleep hygiene tips.    Other: -Provides group and verbal instruction on various topics (see comments)    Knowledge Questionnaire Score: Knowledge Questionnaire Score - 03/01/17 1118      Knowledge Questionnaire Score   Pre Score  15/18 reviewed with patient        Core Components/Risk Factors/Patient Goals at Admission: Personal Goals and Risk Factors at Admission - 03/01/17 1143      Core Components/Risk Factors/Patient Goals on Admission    Weight Management  Yes;Weight Loss    Intervention  Weight Management: Develop a combined nutrition and exercise program designed to reach desired caloric intake, while maintaining appropriate intake of nutrient and fiber, sodium and fats, and appropriate energy  expenditure required for the weight goal.;Weight Management: Provide education and appropriate resources to help participant work on and attain dietary goals.;Weight Management/Obesity: Establish reasonable short term and long term weight goals.;Obesity: Provide education and appropriate resources to help participant work on and attain dietary goals.    Admit Weight  247 lb (112 kg)    Goal Weight: Short Term  242 lb (109.8 kg)    Goal Weight: Long Term  200 lb (90.7 kg)    Expected Outcomes  Short Term: Continue to assess and modify interventions until short term weight is achieved;Long Term: Adherence to nutrition and physical activity/exercise program aimed toward attainment of established weight goal;Weight Loss: Understanding of general recommendations for a balanced deficit meal plan, which promotes 1-2 lb weight loss per week and includes a negative energy balance of [phone] kcal/d;Understanding recommendations for meals to include 15-35% energy as protein, 25-35% energy from fat, 35-60% energy from carbohydrates, less than 22m of dietary cholesterol, 20-35 gm of total fiber daily;Understanding of distribution of calorie intake throughout the day with the consumption of 4-5 meals/snacks    Improve shortness of breath with ADL's  Yes    Intervention  Provide education, individualized exercise plan and daily activity instruction to help decrease symptoms of SOB with activities of daily living.    Expected Outcomes  Short Term: Improve cardiorespiratory fitness to achieve a reduction of symptoms when performing ADLs;Long Term: Be able to perform more ADLs without symptoms or delay the onset of symptoms    Diabetes  Yes    Intervention  Provide education about proper nutrition, including hydration, and aerobic/resistive exercise prescription along with prescribed medications to achieve blood glucose in normal ranges: Fasting glucose 65-99 mg/dL;Provide education about signs/symptoms and action to take  for hypo/hyperglycemia.    Expected Outcomes  Short Term: Participant verbalizes understanding of the signs/symptoms and immediate care of hyper/hypoglycemia, proper foot care and importance of medication, aerobic/resistive exercise and nutrition plan for blood glucose control.;Long Term: Attainment of HbA1C < 7%.    Heart Failure  Yes    Intervention  Provide a combined exercise and nutrition program that is supplemented with education, support and counseling about heart failure. Directed toward relieving symptoms such as shortness of breath, decreased exercise tolerance, and extremity edema.    Expected Outcomes  Improve functional capacity of life;Short term: Attendance in program 2-3 days a week with increased exercise capacity. Reported lower sodium intake. Reported increased fruit and vegetable intake. Reports medication compliance.;Short term: Daily weights obtained and reported for increase. Utilizing diuretic protocols set by physician.;Long term: Adoption of self-care skills and reduction of barriers for early signs and symptoms recognition and intervention leading to self-care maintenance.    Hypertension  Yes    Intervention  Provide education on lifestyle modifcations including regular physical activity/exercise, weight management, moderate sodium restriction and increased consumption of fresh fruit, vegetables, and low fat dairy, alcohol moderation, and smoking cessation.;Monitor prescription use compliance.    Expected Outcomes  Short Term: Continued assessment and intervention until BP is < 140/73m HG in hypertensive participants. < 130/84mHG in hypertensive participants with diabetes, heart failure or chronic kidney disease.;Long Term: Maintenance of blood pressure at goal levels.    Lipids  Yes medications prescribed becuase of Bypass    Intervention  Provide education and support for participant on nutrition & aerobic/resistive exercise along with prescribed medications to achieve LDL  <7032mHDL >28m49m  Expected Outcomes  Short Term: Participant states understanding of desired cholesterol values and is compliant with medications prescribed. Participant is following exercise prescription and nutrition guidelines.;Long Term: Cholesterol controlled with medications as prescribed, with individualized exercise RX and with personalized nutrition plan. Value goals: LDL < 70mg41mL > 40 mg.       Core Components/Risk Factors/Patient Goals Review:  Goals and Risk Factor Review    Row Name 04/04/17 1212             Core Components/Risk Factors/Patient Goals Review   Personal Goals Review  Weight Management/Obesity;Hypertension;Lipids;Improve shortness of breath with ADL's       Review  Mario Ward to a good start in rehab.  His weight has been staying the same.  He is scheduled to meet with dietcian tomorrow to talk about diet and weight loss.  His SOB has been staying the same, but he is still new.  He is staying on top his blood presures and blood sugars at home. He checks his sugars daily and his pressures twice a week.  His doctor has been talking about getting the patch for his sugars versus constant sticking and monitoring.  He has not had any problems with his medications.        Expected Outcomes  Short: Continue to attend class and work on weight loss.  Long: Continue to manage diabetes.           Core Components/Risk Factors/Patient Goals at Discharge (Final Review):  Goals and Risk Factor Review - 04/04/17 1212      Core Components/Risk Factors/Patient Goals Review   Personal Goals Review  Weight Management/Obesity;Hypertension;Lipids;Improve shortness of breath with ADL's    Review  Mario Ward to a good start in rehab.  His weight has been staying the same.  He is scheduled to meet with dietcian tomorrow to talk about diet and weight loss.  His SOB has been staying the same, but he is still new.  He is staying on top his blood presures and blood sugars at home. He  checks his sugars daily and his pressures  twice a week.  His doctor has been talking about getting the patch for his sugars versus constant sticking and monitoring.  He has not had any problems with his medications.     Expected Outcomes  Short: Continue to attend class and work on weight loss.  Long: Continue to manage diabetes.        ITP Comments: ITP Comments    Row Name 03/01/17 1052 03/07/17 0812 03/14/17 0938 04/11/17 0840     ITP Comments  Medical Evaluation completed. Chart sent for review and changes to Dr. Emily Filbert Director of [place]. Diagnosis can be found in [facility] encounter 03/01/16  Eryc called to say that he wont be able to start the program until Feb 25th 2019 due to a surgery procedure on the 20th. He does not want to start and then be out for a week. Informed patient that he is to start on the 25th at 1130 AM.  30 day review completed. ITP sent to Dr. Emily Filbert Director of [place]. Continue with ITP unless changes are made by physician.  30 day review completed. ITP sent to Dr. Emily Filbert Director of [place]. Continue with ITP unless changes are made by physician.       Comments: 30 day review

## 2017-04-11 NOTE — Progress Notes (Signed)
Daily Session Note  Patient Details  Name: Mario Ward MRN: [id number] Date of Birth: 04-11-1945 Referring Provider:     Pulmonary Rehab from 03/01/2017 in [facility] Cardiac and Pulmonary Rehab  Referring Provider  Mario Ward      Encounter Date: 04/11/2017  Check In: Session Check In - 04/11/17 1148      Check-In   Location  [facility]-Cardiac & Pulmonary Rehab    Staff Present  Earlean Shawl, BS, ACSM CEP, Exercise Physiologist;Kindall Swaby Oletta Darter, BA, ACSM CEP, Exercise Physiologist;Joseph Flavia Shipper    Supervising physician immediately available to respond to emergencies  See telemetry face sheet for immediately available ER MD    Medication changes reported      No    Fall or balance concerns reported     No    Warm-up and Cool-down  Performed on first and last piece of equipment    Resistance Training Performed  Yes    VAD Patient?  No      Pain Assessment   Currently in Pain?  No/denies    Multiple Pain Sites  No          Social History   Tobacco Use  Smoking Status Former Smoker  . Packs/day: 1.00  . Years: 40.00  . Pack years: 40.00  . Types: Cigarettes  . Last attempt to quit: 09/07/2006  . Years since quitting: 10.6  Smokeless Tobacco Never Used    Goals Met:  Independence with exercise equipment Exercise tolerated well No report of cardiac concerns or symptoms Strength training completed today  Goals Unmet:  Not Applicable  Comments: Pt able to follow exercise prescription today without complaint.  Will continue to monitor for progression.    Dr. Emily Filbert is Medical Director for [facility] and [facility].

## 2017-04-13 ENCOUNTER — Encounter: Payer: Medicare Other | Admitting: *Deleted

## 2017-04-13 DIAGNOSIS — J449 Chronic obstructive pulmonary disease, unspecified: Secondary | ICD-10-CM

## 2017-04-13 NOTE — Progress Notes (Signed)
Daily Session Note  Patient Details  Name: Mario Ward MRN: [id number] Date of Birth: [birth year] Referring Provider:     Pulmonary Rehab from 03/01/2017 in [facility] Cardiac and Pulmonary Rehab  Referring Provider  Raul Del      Encounter Date: 04/13/2017  Check In: Session Check In - 04/13/17 1124      Check-In   Location  [facility]-Cardiac & Pulmonary Rehab    Staff Present  Renita Papa, RN BSN;Jessica Luan Pulling, MA, RCEP, CCRP, Exercise Physiologist;Joseph Flavia Shipper    Supervising physician immediately available to respond to emergencies  [facility] immediately available ER MD    Physician(s)  Dr. Reita Cliche and Alfred Levins    Medication changes reported      No    Fall or balance concerns reported     No    Warm-up and Cool-down  Performed as group-led instruction    Resistance Training Performed  Yes    VAD Patient?  No      Pain Assessment   Currently in Pain?  No/denies        Exercise Prescription Changes - 04/12/17 1600      Response to Exercise   Blood Pressure (Admit)  136/62    Blood Pressure (Exercise)  130/74    Blood Pressure (Exit)  126/66    Heart Rate (Admit)  82 bpm    Heart Rate (Exercise)  105 bpm    Heart Rate (Exit)  90 bpm    Oxygen Saturation (Admit)  94 %    Oxygen Saturation (Exercise)  92 %    Oxygen Saturation (Exit)  95 %    Rating of Perceived Exertion (Exercise)  12    Perceived Dyspnea (Exercise)  2    Symptoms  none    Duration  Continue with 45 min of aerobic exercise without signs/symptoms of physical distress.    Intensity  THRR unchanged      Progression   Progression  Continue to progress workloads to maintain intensity without signs/symptoms of physical distress.    Average METs  2.57      Resistance Training   Training Prescription  Yes    Weight  6 lbs    Reps  10-15      Interval Training   Interval Training  No      Treadmill   MPH  2    Grade  0    Minutes  15    METs  2.53      NuStep   Level  4    SPM  89    Minutes  15    METs  2.7      REL-XR   Level  3    Speed  54    Minutes  15    METs  2.3      Home Exercise Plan   Plans to continue exercise at  Home (comment) walking, treadmill, recumbent bike    Frequency  Add 2 additional days to program exercise sessions.    Initial Home Exercises Provided  04/04/17       Social History   Tobacco Use  Smoking Status Former Smoker  . Packs/day: 1.00  . Years: 40.00  . Pack years: 40.00  . Types: Cigarettes  . Last attempt to quit: 09/07/2006  . Years since quitting: 10.6  Smokeless Tobacco Never Used    Goals Met:  Proper associated with RPD/PD & O2 Sat Independence with exercise equipment Using PLB without cueing & demonstrates good technique Exercise  tolerated well Strength training completed today  Goals Unmet:  Not Applicable  Comments: Pt able to follow exercise prescription today without complaint.  Will continue to monitor for progression.    Dr. Emily Filbert is Medical Director for [place] and [facility].

## 2017-04-18 DIAGNOSIS — J449 Chronic obstructive pulmonary disease, unspecified: Secondary | ICD-10-CM | POA: Diagnosis not present

## 2017-04-18 NOTE — Progress Notes (Signed)
Daily Session Note  Patient Details  Name: Mario Ward MRN: [id number] Date of Birth: Jul 17, 1945 Referring Provider:     Pulmonary Rehab from 03/01/2017 in [facility] Cardiac and Pulmonary Rehab  Referring Provider  Raul Del      Encounter Date: 04/18/2017  Check In: Session Check In - 04/18/17 1129      Check-In   Location  [facility]-Cardiac & Pulmonary Rehab    Staff Present  Earlean Shawl, BS, ACSM CEP, Exercise Physiologist;Amanda Oletta Darter, BA, ACSM CEP, Exercise Physiologist;Aunisty Reali Flavia Shipper    Supervising physician immediately available to respond to emergencies  [facility] immediately available ER MD    Physician(s)   Dr. Burlene Arnt and Corky Downs    Medication changes reported      No    Fall or balance concerns reported     No    Tobacco Cessation  No Change    Warm-up and Cool-down  Performed as group-led instruction    Resistance Training Performed  Yes    VAD Patient?  No      Pain Assessment   Currently in Pain?  No/denies          Social History   Tobacco Use  Smoking Status Former Smoker  . Packs/day: 1.00  . Years: 40.00  . Pack years: 40.00  . Types: Cigarettes  . Last attempt to quit: 09/07/2006  . Years since quitting: 10.6  Smokeless Tobacco Never Used    Goals Met:  Independence with exercise equipment Exercise tolerated well No report of cardiac concerns or symptoms Strength training completed today  Goals Unmet:  Not Applicable  Comments: Pt able to follow exercise prescription today without complaint.  Will continue to monitor for progression.   Dr. Emily Filbert is Medical Director for [place] and [facility].

## 2017-04-20 DIAGNOSIS — J449 Chronic obstructive pulmonary disease, unspecified: Secondary | ICD-10-CM | POA: Diagnosis not present

## 2017-04-20 NOTE — Progress Notes (Signed)
Daily Session Note  Patient Details  Name: Mario Ward MRN: [id number] Date of Birth: 1945-09-08 Referring Provider:     Pulmonary Rehab from 03/01/2017 in [facility] Cardiac and Pulmonary Rehab  Referring Provider  Raul Del      Encounter Date: 04/20/2017  Check In: Session Check In - 04/20/17 1118      Check-In   Location  [facility]-Cardiac & Pulmonary Rehab    Staff Present  Renita Papa, RN BSN;Emmajean Ratledge Darrin Nipper, [place], RCEP, [place], Exercise Physiologist    Supervising physician immediately available to respond to emergencies  [facility] immediately available ER MD    Physician(s)  Dr. Joni Fears and Jimmye Norman    Medication changes reported      No    Fall or balance concerns reported     No    Tobacco Cessation  No Change    Warm-up and Cool-down  Performed as group-led instruction    Resistance Training Performed  Yes    VAD Patient?  No      Pain Assessment   Currently in Pain?  No/denies          Social History   Tobacco Use  Smoking Status Former Smoker  . Packs/day: 1.00  . Years: 40.00  . Pack years: 40.00  . Types: Cigarettes  . Last attempt to quit: 09/07/2006  . Years since quitting: 10.6  Smokeless Tobacco Never Used    Goals Met:  Independence with exercise equipment Exercise tolerated well No report of cardiac concerns or symptoms Strength training completed today  Goals Unmet:  Not Applicable  Comments: Pt able to follow exercise prescription today without complaint.  Will continue to monitor for progression.   Dr. Emily Filbert is Medical Director for [place] and [facility].

## 2017-04-22 ENCOUNTER — Encounter: Payer: Medicare Other | Admitting: *Deleted

## 2017-04-22 DIAGNOSIS — J449 Chronic obstructive pulmonary disease, unspecified: Secondary | ICD-10-CM

## 2017-04-22 NOTE — Progress Notes (Signed)
Daily Session Note  Patient Details  Name: Mario Ward MRN: [id number] Date of Birth: 1945/04/27 Referring Provider:     Pulmonary Rehab from 03/01/2017 in [facility] Cardiac and Pulmonary Rehab  Referring Provider  Raul Del      Encounter Date: 04/22/2017  Check In: Session Check In - 04/22/17 1129      Check-In   Location  [facility]-Cardiac & Pulmonary Rehab    Staff Present  Alberteen Sam, MA, RCEP, CCRP, Exercise Physiologist;Meredith Sherryll Burger, RN BSN;Joseph Flavia Shipper    Supervising physician immediately available to respond to emergencies  [facility] immediately available ER MD    Physician(s)  Drs. Joni Fears and [employer]    Medication changes reported      No    Fall or balance concerns reported     No    Warm-up and Cool-down  Performed as group-led Higher education careers adviser Performed  Yes    VAD Patient?  No      Pain Assessment   Currently in Pain?  No/denies          Social History   Tobacco Use  Smoking Status Former Smoker  . Packs/day: 1.00  . Years: 40.00  . Pack years: 40.00  . Types: Cigarettes  . Last attempt to quit: 09/07/2006  . Years since quitting: 10.6  Smokeless Tobacco Never Used    Goals Met:  Proper associated with RPD/PD & O2 Sat Independence with exercise equipment Using PLB without cueing & demonstrates good technique Exercise tolerated well No report of cardiac concerns or symptoms Strength training completed today  Goals Unmet:  Not Applicable  Comments: Pt able to follow exercise prescription today without complaint.  Will continue to monitor for progression.    Dr. Emily Filbert is Medical Director for [place] and [facility].

## 2017-04-25 DIAGNOSIS — J449 Chronic obstructive pulmonary disease, unspecified: Secondary | ICD-10-CM | POA: Diagnosis not present

## 2017-04-25 NOTE — Progress Notes (Signed)
Daily Session Note  Patient Details  Name: Mario Ward MRN: [id number] Date of Birth: 05-06-1945 Referring Provider:     Pulmonary Rehab from 03/01/2017 in [facility] Cardiac and Pulmonary Rehab  Referring Provider  Raul Del      Encounter Date: 04/25/2017  Check In: Session Check In - 04/25/17 1119      Check-In   Location  [facility]-Cardiac & Pulmonary Rehab    Staff Present  Earlean Shawl, BS, ACSM CEP, Exercise Physiologist;Amanda Oletta Darter, BA, ACSM CEP, Exercise Physiologist;Muntaha Vermette Flavia Shipper    Supervising physician immediately available to respond to emergencies  [facility] immediately available ER MD    Physician(s)  Dr. Alfred Levins and Siadecki    Medication changes reported      No    Fall or balance concerns reported     No    Tobacco Cessation  No Change    Warm-up and Cool-down  Performed as group-led instruction    Resistance Training Performed  Yes    VAD Patient?  No      Pain Assessment   Currently in Pain?  No/denies          Social History   Tobacco Use  Smoking Status Former Smoker  . Packs/day: 1.00  . Years: 40.00  . Pack years: 40.00  . Types: Cigarettes  . Last attempt to quit: 09/07/2006  . Years since quitting: 10.6  Smokeless Tobacco Never Used    Goals Met:  Independence with exercise equipment Exercise tolerated well No report of cardiac concerns or symptoms Strength training completed today  Goals Unmet:  Not Applicable  Comments: Pt able to follow exercise prescription today without complaint.  Will continue to monitor for progression.   Dr. Emily Filbert is Medical Director for [place] and [facility].

## 2017-04-27 ENCOUNTER — Encounter: Payer: Medicare Other | Admitting: *Deleted

## 2017-04-27 DIAGNOSIS — J449 Chronic obstructive pulmonary disease, unspecified: Secondary | ICD-10-CM

## 2017-04-27 NOTE — Progress Notes (Signed)
Daily Session Note  Patient Details  Name: Mario Ward MRN: [id number] Date of Birth: 1945/06/07 Referring Provider:     Pulmonary Rehab from 03/01/2017 in [facility] Cardiac and Pulmonary Rehab  Referring Provider  Raul Del      Encounter Date: 04/27/2017  Check In: Session Check In - 04/27/17 1130      Check-In   Location  [facility] Pul[facility] Re[facility]    Staff Present  Renita Papa, RN BSN;Joseph Darrin Nipper, [place], RCEP, [place], Exercise Physiologist    Supervising physician immediately available to respond to emergencies  [facility] immediately available ER MD    Physician(s)  Dr. Mariea Clonts and Jimmye Norman     Medication changes reported      No    Fall or balance concerns reported     No    Warm-up and Cool-down  Performed as group-led instruction    Resistance Training Performed  Yes    VAD Patient?  No      Pain Assessment   Currently in Pain?  No/denies        Exercise Prescription Changes - 04/26/17 1400      Response to Exercise   Blood Pressure (Admit)  142/80    Blood Pressure (Exit)  140/82    Heart Rate (Admit)  81 bpm    Heart Rate (Exercise)  85 bpm    Heart Rate (Exit)  78 bpm    Oxygen Saturation (Admit)  92 %    Oxygen Saturation (Exercise)  94 %    Oxygen Saturation (Exit)  94 %    Rating of Perceived Exertion (Exercise)  11    Perceived Dyspnea (Exercise)  2    Symptoms  none    Duration  Continue with 45 min of aerobic exercise without signs/symptoms of physical distress.    Intensity  THRR unchanged      Progression   Progression  Continue to progress workloads to maintain intensity without signs/symptoms of physical distress.    Average METs  2.79      Resistance Training   Training Prescription  Yes    Weight  10 lbs    Reps  10-15      Interval Training   Interval Training  No      Treadmill   MPH  2.3    Grade  1    Minutes  15    METs  3.08      NuStep   Level  4    SPM  84    Minutes  15    METs  2.9      REL-XR   Level  4    Speed  48    Minutes  15    METs  2.4      Home Exercise Plan   Plans to continue exercise at  Home (comment) walking, treadmill, recumbent bike    Frequency  Add 2 additional days to program exercise sessions.    Initial Home Exercises Provided  04/04/17       Social History   Tobacco Use  Smoking Status Former Smoker  . Packs/day: 1.00  . Years: 40.00  . Pack years: 40.00  . Types: Cigarettes  . Last attempt to quit: 09/07/2006  . Years since quitting: 10.6  Smokeless Tobacco Never Used    Goals Met:  Proper associated with RPD/PD & O2 Sat Independence with exercise equipment Using PLB without cueing & demonstrates good technique Exercise tolerated well Strength training completed today  Goals Unmet:  Not Applicable  Comments: Pt able to follow exercise prescription today without complaint.  Will continue to monitor for progression.    Dr. Mark Miller is Medical Director for [facility] and [facility]. 

## 2017-04-29 ENCOUNTER — Encounter: Payer: Medicare Other | Admitting: *Deleted

## 2017-04-29 DIAGNOSIS — J449 Chronic obstructive pulmonary disease, unspecified: Secondary | ICD-10-CM | POA: Diagnosis not present

## 2017-04-29 NOTE — Progress Notes (Signed)
Daily Session Note  Patient Details  Name: Mario Ward MRN: [id number] Date of Birth: 1945/07/18 Referring Provider:     Pulmonary Rehab from 03/01/2017 in [facility] Cardiac and Pulmonary Rehab  Referring Provider  Raul Del      Encounter Date: 04/29/2017  Check In: Session Check In - 04/29/17 1137      Check-In   Location  [facility]iac [facility] Pulmonary Re[facility]    Staff Present  Renita Papa, RN BSN;Jessica Luan Pulling, MA, RCEP, CCRP, Exercise Physiologist;Mary Kellie Shropshire, RN, BSN, MA    Supervising physician immediately available to respond to emergencies  [facility] immediately available ER MD    Physician(s)  Dr. Reita Cliche and Jacqualine Code    Medication changes reported      No    Fall or balance concerns reported     No    Tobacco Cessation  No Change    Warm-up and Cool-down  Performed on first and last piece of equipment    Resistance Training Performed  Yes    VAD Patient?  No      Pain Assessment   Currently in Pain?  No/denies          Social History   Tobacco Use  Smoking Status Former Smoker  . Packs/day: 1.00  . Years: 40.00  . Pack years: 40.00  . Types: Cigarettes  . Last attempt to quit: 09/07/2006  . Years since quitting: 10.6  Smokeless Tobacco Never Used    Goals Met:  Proper associated with RPD/PD & O2 Sat Independence with exercise equipment Using PLB without cueing & demonstrates good technique Exercise tolerated well Strength training completed today  Goals Unmet:  Not Applicable  Comments: Pt able to follow exercise prescription today without complaint.  Will continue to monitor for progression.    Dr. Emily Filbert is Medical Director for [place] and [facility].

## 2017-05-02 ENCOUNTER — Encounter: Payer: Medicare Other | Attending: Specialist

## 2017-05-02 DIAGNOSIS — J449 Chronic obstructive pulmonary disease, unspecified: Secondary | ICD-10-CM | POA: Diagnosis not present

## 2017-05-02 NOTE — Progress Notes (Signed)
Daily Session Note  Patient Details  Name: Mario Ward MRN: [id number] Date of Birth: 01-14-1946 Referring Provider:     Pulmonary Rehab from 03/01/2017 in [facility] Cardiac and Pulmonary Rehab  Referring Provider  Raul Del      Encounter Date: 05/02/2017  Check In: Session Check In - 05/02/17 1140      Check-In   Location  [facility] Pul[facility] Re[facility]    Staff Present  Nada Maclachlan, BA, ACSM CEP, Exercise Physiologist;Kelly Amedeo Plenty, BS, ACSM CEP, Exercise Physiologist;Baylyn Sickles Flavia Shipper    Supervising physician immediately available to respond to emergencies  [facility] immediately available ER MD    Physician(s)  Dr. Jimmye Norman and Corky Downs    Medication changes reported      No    Fall or balance concerns reported     No    Tobacco Cessation  No Change    Warm-up and Cool-down  Performed as group-led instruction    Resistance Training Performed  Yes    VAD Patient?  No      Pain Assessment   Currently in Pain?  No/denies          Social History   Tobacco Use  Smoking Status Former Smoker  . Packs/day: 1.00  . Years: 40.00  . Pack years: 40.00  . Types: Cigarettes  . Last attempt to quit: 09/07/2006  . Years since quitting: 10.6  Smokeless Tobacco Never Used    Goals Met:  Independence with exercise equipment Exercise tolerated well No report of cardiac concerns or symptoms Strength training completed today  Goals Unmet:  Not Applicable  Comments: Pt able to follow exercise prescription today without complaint.  Will continue to monitor for progression.   Dr. Emily Filbert is Medical Director for [place] and [facility].

## 2017-05-04 DIAGNOSIS — J449 Chronic obstructive pulmonary disease, unspecified: Secondary | ICD-10-CM

## 2017-05-04 NOTE — Progress Notes (Signed)
Daily Session Note  Patient Details  Name: Mario Ward MRN: [id number] Date of Birth: [birth year] Referring Provider:     Pulmonary Rehab from 03/01/2017 in [facility] Cardiac and Pulmonary Rehab  Referring Provider  Raul Del      Encounter Date: 05/04/2017  Check In: Session Check In - 05/04/17 1135      Check-In   Location  [facility]iac [facility] Pulmonary Re[facility]    Staff Present  Justin Mend Lorre Nick, [place], RCEP, CCRP, Exercise Physiologist;Meredith Sherryll Burger, RN BSN    Supervising physician immediately available to respond to emergencies  [facility] immediately available ER MD    Physician(s)  Dr. Mable Paris and Jimmye Norman    Medication changes reported      No    Fall or balance concerns reported     No    Tobacco Cessation  No Change    Warm-up and Cool-down  Performed as group-led instruction    Resistance Training Performed  Yes    VAD Patient?  No      Pain Assessment   Currently in Pain?  No/denies          Social History   Tobacco Use  Smoking Status Former Smoker  . Packs/day: 1.00  . Years: 40.00  . Pack years: 40.00  . Types: Cigarettes  . Last attempt to quit: 09/07/2006  . Years since quitting: 10.6  Smokeless Tobacco Never Used    Goals Met:  Independence with exercise equipment Exercise tolerated well No report of cardiac concerns or symptoms Strength training completed today  Goals Unmet:  Not Applicable  Comments: Pt able to follow exercise prescription today without complaint.  Will continue to monitor for progression.   Dr. Emily Filbert is Medical Director for [place] and [facility].

## 2017-05-09 DIAGNOSIS — J449 Chronic obstructive pulmonary disease, unspecified: Secondary | ICD-10-CM

## 2017-05-09 NOTE — Progress Notes (Signed)
Daily Session Note  Patient Details  Name: Mario Ward MRN: [id number] Date of Birth: 11-29-1945 Referring Provider:     Pulmonary Rehab from 03/01/2017 in [facility] Cardiac and Pulmonary Rehab  Referring Provider  Raul Del      Encounter Date: 05/09/2017  Check In: Session Check In - 05/09/17 1119      Check-In   Location  [facility]Cardiac & Pulmonary Rehab    Staff Present  Earlean Shawl, BS, ACSM CEP, Exercise Physiologist;Amanda Oletta Darter, BA, ACSM CEP, Exercise Physiologist;Raydon Chappuis Flavia Shipper    Supervising physician immediately available to respond to emergencies  [facility] immediately available ER MD    Physician(s)   Dr. Corky Downs and [facility]    Medication changes reported      No    Fall or balance concerns reported     No    Tobacco Cessation  No Change    Warm-up and Cool-down  Performed as group-led instruction    Resistance Training Performed  Yes    VAD Patient?  No      Pain Assessment   Currently in Pain?  No/denies          Social History   Tobacco Use  Smoking Status Former Smoker  . Packs/day: 1.00  . Years: 40.00  . Pack years: 40.00  . Types: Cigarettes  . Last attempt to quit: 09/07/2006  . Years since quitting: 10.6  Smokeless Tobacco Never Used    Goals Met:  Independence with exercise equipment Exercise tolerated well No report of cardiac concerns or symptoms Strength training completed today  Goals Unmet:  Not Applicable  Comments: Pt able to follow exercise prescription today without complaint.  Will continue to monitor for progression.   Dr. Emily Filbert is Medical Director for [place] and [facility].

## 2017-05-09 NOTE — Progress Notes (Signed)
Pulmonary Individual Treatment Plan  Patient Details  Name: Mario Ward MRN: [id number] Date of Birth: April 11, 1945 Referring Provider:     Pulmonary Rehab from 03/01/2017 in [facility] Cardiac and Pulmonary Rehab  Referring Provider  Raul Del      Initial Encounter Date:    Pulmonary Rehab from 03/01/2017 in [facility] Cardiac and Pulmonary Rehab  Date  03/01/17  Referring Provider  Raul Del      Visit Diagnosis: Chronic obstructive pulmonary disease, unspecified COPD type ([place])  Patient's Home Medications on Admission:  Current Outpatient Medications:  .  albuterol (PROVENTIL) (2.5 MG/3ML) 0.083% nebulizer solution, Take 2.5 mg by nebulization every 2 (two) hours as needed for wheezing or shortness of breath., Disp: , Rfl:  .  aspirin EC 81 MG tablet, Take 81 mg by mouth daily., Disp: , Rfl:  .  cholecalciferol (VITAMIN D) 1000 UNITS tablet, Take 2,000 Units by mouth daily., Disp: , Rfl:  .  clopidogrel (PLAVIX) 75 MG tablet, Take 75 mg by mouth daily., Disp: , Rfl:  .  lisinopril (PRINIVIL,ZESTRIL) 40 MG tablet, Take 40 mg by mouth daily., Disp: , Rfl:  .  metoprolol tartrate (LOPRESSOR) 25 MG tablet, Take 25 mg by mouth 2 (two) times daily. , Disp: , Rfl:  .  Multiple Vitamin (MULTIVITAMIN) tablet, Take 1 tablet by mouth daily., Disp: , Rfl:  .  nitroGLYCERIN (NITROSTAT) 0.4 MG SL tablet, Place 0.4 mg under the tongue every 5 (five) minutes as needed for chest pain., Disp: , Rfl:  .  Olopatadine HCl (PATADAY) 0.2 % SOLN, Apply 2 drops to eye as needed. , Disp: , Rfl:  .  RABEprazole (ACIPHEX) 20 MG tablet, TAKE 1 TABLET DAILY 45 MINUTES BEFORE BREAKFAST ON AN EMPTY STOMACH, Disp: , Rfl:  .  simvastatin (ZOCOR) 40 MG tablet, TAKE 1 TABLET DAILY, Disp: 90 tablet, Rfl: 3 .  SPIRIVA HANDIHALER 18 MCG inhalation capsule, , Disp: , Rfl:  .  tamsulosin (FLOMAX) 0.4 MG CAPS capsule, Take 1 capsule (0.4 mg total) by mouth daily., Disp: 90 capsule, Rfl: 3 .  vitamin B-12 (CYANOCOBALAMIN) 1000 MCG  tablet, Take 1,000 mcg by mouth daily., Disp: , Rfl:   Past Medical History: Past Medical History:  Diagnosis Date  . AAA (abdominal aortic aneurysm) ([place])   . Anginal pain ([place])   . Arthritis   . COPD (chronic obstructive pulmonary disease) ([place])   . Coronary artery disease 1997   CABG x 4   . 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  . 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 GERD (gastroesophageal reflux disease)   . Hypertension   . Sleep apnea    uses a CPAP at night.   . Vitamin D deficiency     Tobacco Use: Social History   Tobacco Use  Smoking Status Former Smoker  . Packs/day: 1.00  . Years: 40.00  . Pack years: 40.00  . Types: Cigarettes  . Last attempt to quit: 09/07/2006  . Years since quitting: 10.6  Smokeless Tobacco Never Used    Labs: Recent Review Flowsheet Data    There is no flowsheet data to display.       Pulmonary Assessment Scores: Pulmonary Assessment Scores    Row Name 03/01/17 1115         ADL UCSD   ADL Phase  Entry     SOB Score total  31     Rest  0  Walk  2     Stairs  3     Bath  2     Dress  2     Shop  3       CAT Score   CAT Score  12       mMRC Score   mMRC Score  1        Pulmonary Function Assessment: Pulmonary Function Assessment - 03/01/17 1133      Initial Spirometry Results   FVC%  57 %    FEV1%  57 %    FEV1/FVC Ratio  72.66    Comments  good patient effort      Post Bronchodilator Spirometry Results   FVC%  61.56 %    FEV1%  61.56 %    FEV1/FVC Ratio  72.79    Comments  good patient effort      Breath   Bilateral Breath Sounds  Clear    Shortness of Breath  No       Exercise Target Goals:    Exercise Program Goal: Individual exercise prescription set using results from initial 6 min walk test and THRR while considering  patient's activity barriers and safety.    Exercise Prescription Goal: Initial exercise prescription  builds to 30-45 minutes a day of aerobic activity, 2-3 days per week.  Home exercise guidelines will be given to patient during program as part of exercise prescription that the participant will acknowledge.  Activity Barriers & Risk Stratification:   6 Minute Walk: 6 Minute Walk    Row Name 03/01/17 1300 03/01/17 1304       6 Minute Walk   Distance  1200 feet  -    Walk Time  6 minutes  -    # of Rest Breaks  0  -    MPH  2.27  -    METS  2.29  -    RPE  11  -    Perceived Dyspnea   2  -    VO2 Peak  8.01  -    Symptoms  No  -    Resting HR  66 bpm  -    Resting BP  136/76  -    Resting Oxygen Saturation   94 %  -    Exercise Oxygen Saturation  during 6 min walk  88 %  -    Max Ex. HR  95 bpm  -    Max Ex. BP  192/78  -    2 Minute Post BP  166/82  -      Interval HR   1 Minute HR  -  82    2 Minute HR  -  84    3 Minute HR  -  86    4 Minute HR  -  85    6 Minute HR  -  95    2 Minute Post HR  -  69    Interval Heart Rate?  -  Yes      Interval Oxygen   Interval Oxygen?  -  Yes    Baseline Oxygen Saturation %  -  94 %    1 Minute Oxygen Saturation %  -  95 %    1 Minute Liters of Oxygen  -  0 L    2 Minute Oxygen Saturation %  -  89 %    2 Minute Liters of Oxygen  -  0 L  3 Minute Oxygen Saturation %  -  88 %    3 Minute Liters of Oxygen  -  0 L    4 Minute Oxygen Saturation %  -  92 %    4 Minute Liters of Oxygen  -  0 L    5 Minute Liters of Oxygen  -  0 L    6 Minute Oxygen Saturation %  -  95 %    6 Minute Liters of Oxygen  -  0 L    2 Minute Post Oxygen Saturation %  -  97 %    2 Minute Post Liters of Oxygen  -  0 L      Oxygen Initial Assessment: Oxygen Initial Assessment - 03/01/17 1130      Home Oxygen   Home Oxygen Device  None    Sleep Oxygen Prescription  CPAP    Liters per minute  0    Home Exercise Oxygen Prescription  None    Home at Rest Exercise Oxygen Prescription  None    Compliance with Home Oxygen Use  Yes      Initial 6 min  Walk   Oxygen Used  None      Program Oxygen Prescription   Program Oxygen Prescription  None      Intervention   Short Term Goals  To learn and demonstrate proper use of respiratory medications;To learn and demonstrate proper pursed lip breathing techniques or other breathing techniques.;To learn and understand importance of maintaining oxygen saturations>88%;To learn and understand importance of monitoring SPO2 with pulse oximeter and demonstrate accurate use of the pulse oximeter.    Long  Term Goals  Verbalizes importance of monitoring SPO2 with pulse oximeter and return demonstration;Maintenance of O2 saturations>88%;Compliance with respiratory medication;Exhibits proper breathing techniques, such as pursed lip breathing or other method taught during program session;Demonstrates proper use of MDI's       Oxygen Re-Evaluation: Oxygen Re-Evaluation    Row Name 03/28/17 1149 04/25/17 1206           Program Oxygen Prescription   Program Oxygen Prescription  None  None        Home Oxygen   Home Oxygen Device  None  None      Sleep Oxygen Prescription  CPAP  CPAP      Liters per minute  0  0      Home Exercise Oxygen Prescription  None  None      Home at Rest Exercise Oxygen Prescription  None  None      Compliance with Home Oxygen Use  Yes  Yes        Goals/Expected Outcomes   Short Term Goals  To learn and demonstrate proper use of respiratory medications;To learn and demonstrate proper pursed lip breathing techniques or other breathing techniques.;To learn and understand importance of maintaining oxygen saturations>88%;To learn and understand importance of monitoring SPO2 with pulse oximeter and demonstrate accurate use of the pulse oximeter.  To learn and demonstrate proper use of respiratory medications;To learn and demonstrate proper pursed lip breathing techniques or other breathing techniques.;To learn and understand importance of maintaining oxygen saturations>88%;To learn  and understand importance of monitoring SPO2 with pulse oximeter and demonstrate accurate use of the pulse oximeter.      Long  Term Goals  Verbalizes importance of monitoring SPO2 with pulse oximeter and return demonstration;Maintenance of O2 saturations>88%;Compliance with respiratory medication;Exhibits proper breathing techniques, such as pursed lip breathing or other method taught during  program session;Demonstrates proper use of MDI's  Verbalizes importance of monitoring SPO2 with pulse oximeter and return demonstration;Maintenance of O2 saturations>88%;Compliance with respiratory medication;Exhibits proper breathing techniques, such as pursed lip breathing or other method taught during program session;Demonstrates proper use of MDI's      Comments  Reviewed PLB technique with pt.  Talked about how it work and it's important to maintaining his exercise saturations.   Tallen takes his Spiriva every as prescribed. He states that he does not carry his inhaler with him at all times. Informed him that it would be a good idea to have it on him just in case he becomes short of breath.      Goals/Expected Outcomes  Short: Become more profiecient at using PLB.   Long: Become independent at using PLB.  Short: carry rescue inhaler more frequently. Long: independently carry rescue inhaler at all times.         Oxygen Discharge (Final Oxygen Re-Evaluation): Oxygen Re-Evaluation - 04/25/17 1206      Program Oxygen Prescription   Program Oxygen Prescription  None      Home Oxygen   Home Oxygen Device  None    Sleep Oxygen Prescription  CPAP    Liters per minute  0    Home Exercise Oxygen Prescription  None    Home at Rest Exercise Oxygen Prescription  None    Compliance with Home Oxygen Use  Yes      Goals/Expected Outcomes   Short Term Goals  To learn and demonstrate proper use of respiratory medications;To learn and demonstrate proper pursed lip breathing techniques or other breathing techniques.;To  learn and understand importance of maintaining oxygen saturations>88%;To learn and understand importance of monitoring SPO2 with pulse oximeter and demonstrate accurate use of the pulse oximeter.    Long  Term Goals  Verbalizes importance of monitoring SPO2 with pulse oximeter and return demonstration;Maintenance of O2 saturations>88%;Compliance with respiratory medication;Exhibits proper breathing techniques, such as pursed lip breathing or other method taught during program session;Demonstrates proper use of MDI's    Comments  Semaje takes his Spiriva every as prescribed. He states that he does not carry his inhaler with him at all times. Informed him that it would be a good idea to have it on him just in case he becomes short of breath.    Goals/Expected Outcomes  Short: carry rescue inhaler more frequently. Long: independently carry rescue inhaler at all times.       Initial Exercise Prescription: Initial Exercise Prescription - 03/01/17 1300      Date of Initial Exercise RX and Referring Provider   Date  03/01/17    Referring Provider  Raul Del      Treadmill   MPH  2    Grade  0    Minutes  15    METs  2.5      NuStep   Level  2    SPM  80    Minutes  15    METs  2.2      REL-XR   Level  2    Speed  50    Minutes  15    METs  2.2      Biostep-RELP   Level  3    SPM  50    Minutes  15    METs  2      Prescription Details   Frequency (times per week)  3    Duration  Progress to 45 minutes of aerobic  exercise without signs/symptoms of physical distress      Intensity   THRR 40-80% of Max Heartrate  98-130    Ratings of Perceived Exertion  11-13    Perceived Dyspnea  0-4      Resistance Training   Training Prescription  Yes    Weight  3 lb    Reps  10-15       Perform Capillary Blood Glucose checks as needed.  Exercise Prescription Changes: Exercise Prescription Changes    Row Name 03/28/17 1600 04/04/17 1200 04/12/17 1600 04/26/17 1400       Response to  Exercise   Blood Pressure (Admit)  142/86  -  136/62  142/80    Blood Pressure (Exercise)  152/74  -  130/74  -    Blood Pressure (Exit)  134/68  -  126/66  140/82    Heart Rate (Admit)  72 bpm  -  82 bpm  81 bpm    Heart Rate (Exercise)  96 bpm  -  105 bpm  85 bpm    Heart Rate (Exit)  93 bpm  -  90 bpm  78 bpm    Oxygen Saturation (Admit)  94 %  -  94 %  92 %    Oxygen Saturation (Exercise)  92 %  -  92 %  94 %    Oxygen Saturation (Exit)  94 %  -  95 %  94 %    Rating of Perceived Exertion (Exercise)  13  -  12  11    Perceived Dyspnea (Exercise)  2  -  2  2    Symptoms  -  -  none  none    Comments  First full day of exercise  -  -  -    Duration  Continue with 45 min of aerobic exercise without signs/symptoms of physical distress.  -  Continue with 45 min of aerobic exercise without signs/symptoms of physical distress.  Continue with 45 min of aerobic exercise without signs/symptoms of physical distress.    Intensity  THRR unchanged  -  THRR unchanged  THRR unchanged      Progression   Progression  Continue to progress workloads to maintain intensity without signs/symptoms of physical distress.  -  Continue to progress workloads to maintain intensity without signs/symptoms of physical distress.  Continue to progress workloads to maintain intensity without signs/symptoms of physical distress.    Average METs  2.51  -  2.57  2.79      Resistance Training   Training Prescription  Yes  -  Yes  Yes    Weight  3 lb  -  6 lbs  10 lbs    Reps  10-15  -  10-15  10-15      Interval Training   Interval Training  No  -  No  No      Oxygen   Oxygen  -  -  -  -    Liters  -  -  -  -      Treadmill   MPH  2  -  2  2.3    Grade  0  -  0  1    Minutes  15  -  15  15    METs  2.53  -  2.53  3.08      NuStep   Level  2  -  4  4    SPM  94  -  89  84    Minutes  15  -  15  15    METs  2.6  -  2.7  2.9      Recumbant Elliptical   Level  -  -  -  -    RPM  -  -  -  -    Minutes  -  -   -  -    METs  -  -  -  -      REL-XR   Level  2  -  3  4    Speed  52  -  54  48    Minutes  15  -  15  15    METs  2.4  -  2.3  2.4      T5 Nustep   Level  -  -  -  -    SPM  -  -  -  -    Minutes  -  -  -  -    METs  -  -  -  -      Home Exercise Plan   Plans to continue exercise at  -  Home (comment) walking, treadmill, recumbent bike  Home (comment) walking, treadmill, recumbent bike  Home (comment) walking, treadmill, recumbent bike    Frequency  -  Add 2 additional days to program exercise sessions.  Add 2 additional days to program exercise sessions.  Add 2 additional days to program exercise sessions.    Initial Home Exercises Provided  -  04/04/17  04/04/17  04/04/17       Exercise Comments: Exercise Comments    Row Name 03/28/17 1149           Exercise Comments  First full day of exercise!  Patient was oriented to gym and equipment including functions, settings, policies, and procedures.  Patient's individual exercise prescription and treatment plan were reviewed.  All starting workloads were established based on the results of the 6 minute walk test done at initial orientation visit.  The plan for exercise progression was also introduced and progression will be customized based on patient's performance and goals.          Exercise Goals and Review: Exercise Goals    Row Name 03/01/17 1259             Exercise Goals   Increase Physical Activity  Yes       Intervention  Provide advice, education, support and counseling about physical activity/exercise needs.;Develop an individualized exercise prescription for aerobic and resistive training based on initial evaluation findings, risk stratification, comorbidities and participant's personal goals.       Expected Outcomes  Short Term: Attend rehab on a regular basis to increase amount of physical activity.;Long Term: Add in home exercise to make exercise part of routine and to increase amount of physical  activity.;Long Term: Exercising regularly at least 3-5 days a week.       Increase Strength and Stamina  Yes       Intervention  Provide advice, education, support and counseling about physical activity/exercise needs.;Develop an individualized exercise prescription for aerobic and resistive training based on initial evaluation findings, risk stratification, comorbidities and participant's personal goals.       Expected Outcomes  Short Term: Increase workloads from initial exercise prescription for resistance, speed, and METs.;Short Term: Perform resistance training exercises routinely during rehab and add in resistance training at home;Long Term: Improve cardiorespiratory fitness,  muscular endurance and strength as measured by increased METs and functional capacity (6MWT)       Able to understand and use rate of perceived exertion (RPE) scale  Yes       Intervention  Provide education and explanation on how to use RPE scale       Expected Outcomes  Short Term: Able to use RPE daily in rehab to express subjective intensity level;Long Term:  Able to use RPE to guide intensity level when exercising independently       Able to understand and use Dyspnea scale  Yes       Intervention  Provide education and explanation on how to use Dyspnea scale       Expected Outcomes  Short Term: Able to use Dyspnea scale daily in rehab to express subjective sense of shortness of breath during exertion;Long Term: Able to use Dyspnea scale to guide intensity level when exercising independently       Knowledge and understanding of Target Heart Rate Range (THRR)  Yes       Intervention  Provide education and explanation of THRR including how the numbers were predicted and where they are located for reference       Expected Outcomes  Short Term: Able to state/look up THRR;Long Term: Able to use THRR to govern intensity when exercising independently;Short Term: Able to use daily as guideline for intensity in rehab       Able  to check pulse independently  Yes       Intervention  Provide education and demonstration on how to check pulse in carotid and radial arteries.;Review the importance of being able to check your own pulse for safety during independent exercise       Expected Outcomes  Short Term: Able to explain why pulse checking is important during independent exercise;Long Term: Able to check pulse independently and accurately       Understanding of Exercise Prescription  Yes       Intervention  Provide education, explanation, and written materials on patient's individual exercise prescription       Expected Outcomes  Short Term: Able to explain program exercise prescription;Long Term: Able to explain home exercise prescription to exercise independently          Exercise Goals Re-Evaluation : Exercise Goals Re-Evaluation    Row Name 03/28/17 1149 04/04/17 1240 04/12/17 1610 04/26/17 1457       Exercise Goal Re-Evaluation   Exercise Goals Review  Understanding of Exercise Prescription;Able to understand and use Dyspnea scale;Knowledge and understanding of Target Heart Rate Range (THRR);Able to understand and use rate of perceived exertion (RPE) scale  Understanding of Exercise Prescription;Able to understand and use Dyspnea scale;Knowledge and understanding of Target Heart Rate Range (THRR);Able to understand and use rate of perceived exertion (RPE) scale;Increase Physical Activity;Able to check pulse independently  Increase Physical Activity;Understanding of Exercise Prescription;Increase Strength and Stamina  Increase Physical Activity;Understanding of Exercise Prescription;Increase Strength and Stamina    Comments  Reviewed RPE scale, THR and program prescription with pt today.  Pt voiced understanding and was given a copy of goals to take home.   Reviewed home exercise with pt today.  Pt plans to walking and using treadmill and recumbent bike at home for exercise.  Reviewed THR, pulse, RPE, sign and symptoms,  and when to call 911 or MD.  Also discussed weather considerations and indoor options.  Pt voiced understanding.  Liliane Channel has been doing well in rehab.  He is  now up to level 3 on the XR and level 4 on NuStep.  We will continue to monitor his progression.   Liliane Channel continues to do well in rehab.  He has added a 1% grade to his treadmill and is up to level 4 on the XR.   He has also increased his weights to 10 lbs.  We will continue to monitor his progression.     Expected Outcomes  Short: Use RPE daily to regulate intensity.  Long: Follow program prescription in THR.  Short: Continue to exercise by adding in one extra day a week for 30 min routinely.  Long: Continue to exercise regularly to increase strength and stamina.   Short: Add incline on treadmill.   Long: Continue to exercise on off days.   Short: Work on increasing METs on the XR.  Long: Continue to build strength and stamina.        Discharge Exercise Prescription (Final Exercise Prescription Changes): Exercise Prescription Changes - 04/26/17 1400      Response to Exercise   Blood Pressure (Admit)  142/80    Blood Pressure (Exit)  140/82    Heart Rate (Admit)  81 bpm    Heart Rate (Exercise)  85 bpm    Heart Rate (Exit)  78 bpm    Oxygen Saturation (Admit)  92 %    Oxygen Saturation (Exercise)  94 %    Oxygen Saturation (Exit)  94 %    Rating of Perceived Exertion (Exercise)  11    Perceived Dyspnea (Exercise)  2    Symptoms  none    Duration  Continue with 45 min of aerobic exercise without signs/symptoms of physical distress.    Intensity  THRR unchanged      Progression   Progression  Continue to progress workloads to maintain intensity without signs/symptoms of physical distress.    Average METs  2.79      Resistance Training   Training Prescription  Yes    Weight  10 lbs    Reps  10-15      Interval Training   Interval Training  No      Treadmill   MPH  2.3    Grade  1    Minutes  15    METs  3.08      NuStep    Level  4    SPM  84    Minutes  15    METs  2.9      REL-XR   Level  4    Speed  48    Minutes  15    METs  2.4      Home Exercise Plan   Plans to continue exercise at  Home (comment) walking, treadmill, recumbent bike    Frequency  Add 2 additional days to program exercise sessions.    Initial Home Exercises Provided  04/04/17       Nutrition:  Target Goals: Understanding of nutrition guidelines, daily intake of sodium <1559m, cholesterol <2054m calories 30% from fat and 7% or less from saturated fats, daily to have 5 or more servings of fruits and vegetables.  Biometrics: Pre Biometrics - 03/01/17 1259      Pre Biometrics   Height  '5\' 7"'  (1.702 m)    Weight  247 lb 6.4 oz (112.2 kg)    Waist Circumference  45 inches    Hip Circumference  48 inches    Waist to Hip Ratio  0.94 %  BMI (Calculated)  38.74        Nutrition Therapy Plan and Nutrition Goals: Nutrition Therapy & Goals - 03/01/17 1113      Personal Nutrition Goals   Comments  Weight loss. He would like to meet with the dietician.      Intervention Plan   Intervention  Prescribe, educate and counsel regarding individualized specific dietary modifications aiming towards targeted core components such as weight, hypertension, lipid management, diabetes, heart failure and other comorbidities.;Nutrition handout(s) given to patient.    Expected Outcomes  Short Term Goal: Understand basic principles of dietary content, such as calories, fat, sodium, cholesterol and nutrients.;Long Term Goal: Adherence to prescribed nutrition plan.       Nutrition Assessments: Nutrition Assessments - 03/01/17 1113      MEDFICTS Scores   Pre Score  66       Nutrition Goals Re-Evaluation: Nutrition Goals Re-Evaluation    Row Name 04/04/17 1216 04/25/17 1210           Goals   Current Weight  -  246 lb (111.6 kg)      Nutrition Goal  meet with dietician 3/5  Lose weight and try to count calories.      Comment  Discuss  weight loss and diet tips  He is supposed to try to eat 1800-2000 calories a day. Khaidyn uses an app to count his calories, but is just getting into the habit of logging his calories. He drinks water of flavored water.  His portion control could use work.      Expected Outcome  Short: Attend appt.  Long: Work on dieting and working on weigh loss and following recommendations.   Short: cut back on portions. Long: lose weight by cutting back on portions.         Nutrition Goals Discharge (Final Nutrition Goals Re-Evaluation): Nutrition Goals Re-Evaluation - 04/25/17 1210      Goals   Current Weight  246 lb (111.6 kg)    Nutrition Goal  Lose weight and try to count calories.    Comment  He is supposed to try to eat 1800-2000 calories a day. Patterson uses an app to count his calories, but is just getting into the habit of logging his calories. He drinks water of flavored water.  His portion control could use work.    Expected Outcome  Short: cut back on portions. Long: lose weight by cutting back on portions.       Psychosocial: Target Goals: Acknowledge presence or absence of significant depression and/or stress, maximize coping skills, provide positive support system. Participant is able to verbalize types and ability to use techniques and skills needed for reducing stress and depression.   Initial Review & Psychosocial Screening: Initial Psych Review & Screening - 03/01/17 1112      Initial Review   Current issues with  None Identified      Family Dynamics   Good Support System?  Yes    Comments  He looks to his wife and kids for support      Barriers   Psychosocial barriers to participate in program  There are no identifiable barriers or psychosocial needs.      Screening Interventions   Interventions  Encouraged to exercise;Program counselor consult;Provide feedback about the scores to participant;To provide support and resources with identified psychosocial needs    Expected  Outcomes  Short Term goal: Utilizing psychosocial counselor, staff and physician to assist with identification of specific Stressors or current issues  interfering with healing process. Setting desired goal for each stressor or current issue identified.;Long Term Goal: Stressors or current issues are controlled or eliminated.       Quality of Life Scores:  Scores of 19 and below usually indicate a poorer quality of life in these areas.  A difference of  2-3 points is a clinically meaningful difference.  A difference of 2-3 points in the total score of the Quality of Life Index has been associated with significant improvement in overall quality of life, self-image, physical symptoms, and general health in studies assessing change in quality of life.  PHQ-9: Recent Review Flowsheet Data    Depression screen [facility] 2/9 03/01/2017   Decreased Interest 0   Down, Depressed, Hopeless 0   PHQ - 2 Score 0   Altered sleeping 0   Tired, decreased energy 1   Change in appetite 2   Feeling bad or failure about yourself  0   Trouble concentrating 0   Moving slowly or fidgety/restless 0   Suicidal thoughts 0   PHQ-9 Score 3   Difficult doing work/chores Somewhat difficult     Interpretation of Total Score  Total Score Depression Severity:  1-4 = Minimal depression, 5-9 = Mild depression, 10-14 = Moderate depression, 15-19 = Moderately severe depression, 20-27 = Severe depression   Psychosocial Evaluation and Intervention: Psychosocial Evaluation - 04/20/17 1225      Psychosocial Evaluation & Interventions   Interventions  Encouraged to exercise with the program and follow exercise prescription    Comments  Counselor met with Mr. Caraher Heindel) today for initial psychosocial evaluation.  He is a 72 year old who has been diagnosed with COPD.  Chane has a strong support system with a spouse of 16 years and a son who lives here locally.  He moved from [place] in 2014 and enjoys the weather here in  [place].  Krayton has arthritis; diabetes; and sleep apnea on top of his pulmonary diagnosis.  He reports sleeping better since he uses a CPAP and has a very good appetite.  Ebubechukwu denies a history of depression or anxiety or any current symptoms.  He is typically in a positive mood and has minimal stress in his life.  Ason has goals to lose weight and get in the "habit of exercise" while in this program.  He will be followed with staff.    Expected Outcomes  Pawan will benefit from consistent exercise to achieve his stated goals.  The educational and psychoeducational components of this program will be helpful in understanding and managing his condition more positively.  ST goal - to meet with the dietician to address his weight loss goal.  LT - to attend and exercise consistently to get in the habit of this healthy practice.      Continue Psychosocial Services   Follow up required by staff       Psychosocial Re-Evaluation: Psychosocial Re-Evaluation    [place] Name 04/04/17 1217 04/25/17 1217           Psychosocial Re-Evaluation   Current issues with  None Identified  None Identified      Comments  Jahziah continues to do well mentally.  He has no major stressors in his life and has a strong support system.  He also sleeps well at night as long as he use his CPAP.  He is enjoying the exercise as part of the program.  Liliane Channel states he has no stresses at home, does not work and everything  is good.      Expected Outcomes  Short: Continue to attend rehab regualrly.  Long: Continue to maintain postivie attitude.   Short: Continue to attend rehab regualrly.  Long: Continue to maintain postivie attitude.       Interventions  Encouraged to attend Cardiac Rehabilitation for the exercise;Stress management education  Encouraged to attend Pulmonary Rehabilitation for the exercise      Continue Psychosocial Services   Follow up required by staff  Follow up required by staff         Psychosocial Discharge  (Final Psychosocial Re-Evaluation): Psychosocial Re-Evaluation - 04/25/17 1217      Psychosocial Re-Evaluation   Current issues with  None Identified    Comments  Liliane Channel states he has no stresses at home, does not work and everything is good.    Expected Outcomes  Short: Continue to attend rehab regualrly.  Long: Continue to maintain postivie attitude.     Interventions  Encouraged to attend Pulmonary Rehabilitation for the exercise    Continue Psychosocial Services   Follow up required by staff       Education: Education Goals: Education classes will be provided on a weekly basis, covering required topics. Participant will state understanding/return demonstration of topics presented.  Learning Barriers/Preferences: Learning Barriers/Preferences - 03/01/17 1117      Learning Barriers/Preferences   Learning Barriers  Sight wears glasses    Learning Preferences  None       Education Topics:  Initial Evaluation Education: - Verbal, written and demonstration of respiratory meds, oximetry and breathing techniques. Instruction on use of nebulizers and MDIs and importance of monitoring MDI activations.   Pulmonary Rehab from 05/02/2017 in [facility] Cardiac and Pulmonary Rehab  Date  03/01/17  Educator  [facility]  Instruction Review Code  1- Verbalizes Understanding      General Nutrition Guidelines/Fats and Fiber: -Group instruction provided by verbal, written material, models and posters to present the general guidelines for heart healthy nutrition. Gives an explanation and review of dietary fats and fiber.   Pulmonary Rehab from 05/02/2017 in [facility] Cardiac and Pulmonary Rehab  Date  04/25/17  Educator  CR  Instruction Review Code  1- Verbalizes Understanding      Controlling Sodium/Reading Food Labels: -Group verbal and written material supporting the discussion of sodium use in heart healthy nutrition. Review and explanation with models, verbal and written materials for utilization of the food  label.   Pulmonary Rehab from 05/02/2017 in [facility] Cardiac and Pulmonary Rehab  Date  05/02/17  Educator  CR  Instruction Review Code  1- Verbalizes Understanding      Exercise Physiology & General Exercise Guidelines: - Group verbal and written instruction with models to review the exercise physiology of the cardiovascular system and associated critical values. Provides general exercise guidelines with specific guidelines to those with heart or lung disease.    Aerobic Exercise & Resistance Training: - Gives group verbal and written instruction on the various components of exercise. Focuses on aerobic and resistive training programs and the benefits of this training and how to safely progress through these programs.   Flexibility, Balance, Mind/Body Relaxation: Provides group verbal/written instruction on the benefits of flexibility and balance training, including mind/body exercise modes such as yoga, pilates and tai chi.  Demonstration and skill practice provided.   Stress and Anxiety: - Provides group verbal and written instruction about the health risks of elevated stress and causes of high stress.  Discuss the correlation between heart/lung disease and  anxiety and treatment options. Review healthy ways to manage with stress and anxiety.   Pulmonary Rehab from 05/02/2017 in [facility] Cardiac and Pulmonary Rehab  Date  04/06/17  Educator  [facility]  Instruction Review Code  1- Verbalizes Understanding      Depression: - Provides group verbal and written instruction on the correlation between heart/lung disease and depressed mood, treatment options, and the stigmas associated with seeking treatment.   Exercise & Equipment Safety: - Individual verbal instruction and demonstration of equipment use and safety with use of the equipment.   Pulmonary Rehab from 05/02/2017 in [facility] Cardiac and Pulmonary Rehab  Date  03/01/17  Educator  [facility]  Instruction Review Code  1- Verbalizes Understanding       Infection Prevention: - Provides verbal and written material to individual with discussion of infection control including proper hand washing and proper equipment cleaning during exercise session.   Pulmonary Rehab from 05/02/2017 in [facility] Cardiac and Pulmonary Rehab  Date  03/01/17  Educator  [facility]  Instruction Review Code  1- Verbalizes Understanding      Falls Prevention: - Provides verbal and written material to individual with discussion of falls prevention and safety.   Pulmonary Rehab from 05/02/2017 in [facility] Cardiac and Pulmonary Rehab  Date  03/01/17  Educator  [facility]  Instruction Review Code  1- Verbalizes Understanding      Diabetes: - Individual verbal and written instruction to review signs/symptoms of diabetes, desired ranges of glucose level fasting, after meals and with exercise. Advice that pre and post exercise glucose checks will be done for 3 sessions at entry of program.   Chronic Lung Diseases: - Group verbal and written instruction to review updates, respiratory medications, advancements in procedures and treatments. Discuss use of supplemental oxygen including available portable oxygen systems, continuous and intermittent flow rates, concentrators, personal use and safety guidelines. Review proper use of inhaler and spacers. Provide informative websites for self-education.    Pulmonary Rehab from 05/02/2017 in [facility] Cardiac and Pulmonary Rehab  Date  04/27/17  Educator  [facility]  Instruction Review Code  1- Verbalizes Understanding      Energy Conservation: - Provide group verbal and written instruction for methods to conserve energy, plan and organize activities. Instruct on pacing techniques, use of adaptive equipment and posture/positioning to relieve shortness of breath.   Pulmonary Rehab from 05/02/2017 in [facility] Cardiac and Pulmonary Rehab  Date  03/30/17  Educator  [facility]  Instruction Review Code  1- Verbalizes Understanding      Triggers and Exacerbations: - Group  verbal and written instruction to review types of environmental triggers and ways to prevent exacerbations. Discuss weather changes, air quality and the benefits of nasal washing. Review warning signs and symptoms to help prevent infections. Discuss techniques for effective airway clearance, coughing, and vibrations.   AED/CPR: - Group verbal and written instruction with the use of models to demonstrate the basic use of the AED with the basic ABC's of resuscitation.   Anatomy and Physiology of the Lungs: - Group verbal and written instruction with the use of models to provide basic lung anatomy and physiology related to function, structure and complications of lung disease.   Pulmonary Rehab from 05/02/2017 in [facility] Cardiac and Pulmonary Rehab  Date  04/13/17  Educator  [facility]  Instruction Review Code  1- Verbalizes Understanding      Anatomy & Physiology of the Heart: - Group verbal and written instruction and models provide basic cardiac anatomy and physiology, with the  coronary electrical and arterial systems. Review of Valvular disease and Heart Failure   Cardiac Medications: - Group verbal and written instruction to review commonly prescribed medications for heart disease. Reviews the medication, class of the drug, and side effects.   Know Your Numbers and Risk Factors: -Group verbal and written instruction about important numbers in your health.  Discussion of what are risk factors and how they play a role in the disease process.  Review of Cholesterol, Blood Pressure, Diabetes, and BMI and the role they play in your overall health.   Sleep Hygiene: -Provides group verbal and written instruction about how sleep can affect your health.  Define sleep hygiene, discuss sleep cycles and impact of sleep habits. Review good sleep hygiene tips.    Other: -Provides group and verbal instruction on various topics (see comments)    Knowledge Questionnaire Score: Knowledge Questionnaire Score  - 03/01/17 1118      Knowledge Questionnaire Score   Pre Score  15/18 reviewed with patient        Core Components/Risk Factors/Patient Goals at Admission: Personal Goals and Risk Factors at Admission - 03/01/17 1143      Core Components/Risk Factors/Patient Goals on Admission    Weight Management  Yes;Weight Loss    Intervention  Weight Management: Develop a combined nutrition and exercise program designed to reach desired caloric intake, while maintaining appropriate intake of nutrient and fiber, sodium and fats, and appropriate energy expenditure required for the weight goal.;Weight Management: Provide education and appropriate resources to help participant work on and attain dietary goals.;Weight Management/Obesity: Establish reasonable short term and long term weight goals.;Obesity: Provide education and appropriate resources to help participant work on and attain dietary goals.    Admit Weight  247 lb (112 kg)    Goal Weight: Short Term  242 lb (109.8 kg)    Goal Weight: Long Term  200 lb (90.7 kg)    Expected Outcomes  Short Term: Continue to assess and modify interventions until short term weight is achieved;Long Term: Adherence to nutrition and physical activity/exercise program aimed toward attainment of established weight goal;Weight Loss: Understanding of general recommendations for a balanced deficit meal plan, which promotes 1-2 lb weight loss per week and includes a negative energy balance of [phone] kcal/d;Understanding recommendations for meals to include 15-35% energy as protein, 25-35% energy from fat, 35-60% energy from carbohydrates, less than 280m of dietary cholesterol, 20-35 gm of total fiber daily;Understanding of distribution of calorie intake throughout the day with the consumption of 4-5 meals/snacks    Improve shortness of breath with ADL's  Yes    Intervention  Provide education, individualized exercise plan and daily activity instruction to help decrease symptoms of  SOB with activities of daily living.    Expected Outcomes  Short Term: Improve cardiorespiratory fitness to achieve a reduction of symptoms when performing ADLs;Long Term: Be able to perform more ADLs without symptoms or delay the onset of symptoms    Diabetes  Yes    Intervention  Provide education about proper nutrition, including hydration, and aerobic/resistive exercise prescription along with prescribed medications to achieve blood glucose in normal ranges: Fasting glucose 65-99 mg/dL;Provide education about signs/symptoms and action to take for hypo/hyperglycemia.    Expected Outcomes  Short Term: Participant verbalizes understanding of the signs/symptoms and immediate care of hyper/hypoglycemia, proper foot care and importance of medication, aerobic/resistive exercise and nutrition plan for blood glucose control.;Long Term: Attainment of HbA1C < 7%.    Heart Failure  Yes  Intervention  Provide a combined exercise and nutrition program that is supplemented with education, support and counseling about heart failure. Directed toward relieving symptoms such as shortness of breath, decreased exercise tolerance, and extremity edema.    Expected Outcomes  Improve functional capacity of life;Short term: Attendance in program 2-3 days a week with increased exercise capacity. Reported lower sodium intake. Reported increased fruit and vegetable intake. Reports medication compliance.;Short term: Daily weights obtained and reported for increase. Utilizing diuretic protocols set by physician.;Long term: Adoption of self-care skills and reduction of barriers for early signs and symptoms recognition and intervention leading to self-care maintenance.    Hypertension  Yes    Intervention  Provide education on lifestyle modifcations including regular physical activity/exercise, weight management, moderate sodium restriction and increased consumption of fresh fruit, vegetables, and low fat dairy, alcohol moderation,  and smoking cessation.;Monitor prescription use compliance.    Expected Outcomes  Short Term: Continued assessment and intervention until BP is < 140/31m HG in hypertensive participants. < 130/889mHG in hypertensive participants with diabetes, heart failure or chronic kidney disease.;Long Term: Maintenance of blood pressure at goal levels.    Lipids  Yes medications prescribed becuase of Bypass    Intervention  Provide education and support for participant on nutrition & aerobic/resistive exercise along with prescribed medications to achieve LDL <7066mHDL >43m49m  Expected Outcomes  Short Term: Participant states understanding of desired cholesterol values and is compliant with medications prescribed. Participant is following exercise prescription and nutrition guidelines.;Long Term: Cholesterol controlled with medications as prescribed, with individualized exercise RX and with personalized nutrition plan. Value goals: LDL < 70mg61mL > 40 mg.       Core Components/Risk Factors/Patient Goals Review:  Goals and Risk Factor Review    Row Name 04/04/17 1212 04/25/17 1200           Core Components/Risk Factors/Patient Goals Review   Personal Goals Review  Weight Management/Obesity;Hypertension;Lipids;Improve shortness of breath with ADL's  Weight Management/Obesity;Hypertension;Lipids;Improve shortness of breath with ADL's      Review  Rick Liliane Channelff to a good start in rehab.  His weight has been staying the same.  He is scheduled to meet with dietcian tomorrow to talk about diet and weight loss.  His SOB has been staying the same, but he is still new.  He is staying on top his blood presures and blood sugars at home. He checks his sugars daily and his pressures twice a week.  His doctor has been talking about getting the patch for his sugars versus constant sticking and monitoring.  He has not had any problems with his medications.   [place] [place]. He was trimming using a weed eater and felt like his lungs were burning. Ricks shortness of breath he feels has not improved that much. He had to use his inhaler when he was doing yard work last weekend.  He states that he does over exherts himself.      Expected Outcomes  Short: Continue to attend class and work on weight loss.  Long: Continue to manage diabetes.   Short: try to complete task overtime instead of hurrying. Long: maintain not overexherting independently         Core Components/Risk Factors/Patient Goals at Discharge (Final Review):  Goals and Risk Factor Review - 04/25/17 1200      Core Components/Risk Factors/Patient Goals Review   Personal Goals Review  Weight Management/Obesity;Hypertension;Lipids;Improve shortness of breath with ADL's    Review  Reinaldo is doing well in [facility]. He states he gets short of breath doing yardwork. He was trimming using a weed eater and felt like his lungs were burning. Ricks shortness of breath he feels has not improved that much. He had to use his inhaler when he was doing yard work last weekend.  He states that he does over exherts himself.    Expected Outcomes  Short: try to complete task overtime instead of hurrying. Long: maintain not overexherting independently       ITP Comments: ITP Comments    Row Name 03/01/17 1052 03/07/17 0812 03/14/17 0938 04/11/17 0840 05/09/17 0903   ITP Comments  Medical Evaluation completed. Chart sent for review and changes to Dr. Emily Filbert Director of [place]. Diagnosis can be found in [facility] encounter 03/01/16  Teja called to say that he wont be able to start the program until Feb 25th 2019 due to a surgery procedure on the 20th. He does not want to start and then be out for a week. Informed patient that he is to start on the 25th at 1130 AM.  30 day review completed. ITP sent to Dr. Emily Filbert Director of [place]. Continue with ITP unless changes are made by physician.  30 day  review completed. ITP sent to Dr. Emily Filbert Director of [place]. Continue with ITP unless changes are made by physician.   30 day review completed. ITP sent to Dr. Emily Filbert Director of [place]. Continue with ITP unless changes are made by physician      Comments: 30 day review

## 2017-05-11 ENCOUNTER — Encounter: Payer: Medicare Other | Admitting: *Deleted

## 2017-05-11 DIAGNOSIS — J449 Chronic obstructive pulmonary disease, unspecified: Secondary | ICD-10-CM | POA: Diagnosis not present

## 2017-05-11 NOTE — Progress Notes (Signed)
Daily Session Note  Patient Details  Name: Mario Ward MRN: [id number] Date of Birth: March 15, 1945 Referring Provider:     Pulmonary Rehab from 03/01/2017 in [facility] Cardiac and Pulmonary Rehab  Referring Provider  Raul Del      Encounter Date: 05/11/2017  Check In: Session Check In - 05/11/17 1118      Check-In   Location  [facility]-Cardiac & Pulmonary Rehab    Staff Present  Alberteen Sam, MA, RCEP, CCRP, Exercise Physiologist;Kimberly Nieland Sherryll Burger, RN BSN;Joseph Flavia Shipper    Supervising physician immediately available to respond to emergencies  [facility] immediately available ER MD    Physician(s)  Dr. Jimmye Norman and Clearnce Hasten    Medication changes reported      No    Fall or balance concerns reported     No    Tobacco Cessation  No Change    Warm-up and Cool-down  Performed as group-led instruction    Resistance Training Performed  Yes    VAD Patient?  No      Pain Assessment   Currently in Pain?  No/denies          Social History   Tobacco Use  Smoking Status Former Smoker  . Packs/day: 1.00  . Years: 40.00  . Pack years: 40.00  . Types: Cigarettes  . Last attempt to quit: 09/07/2006  . Years since quitting: 10.6  Smokeless Tobacco Never Used    Goals Met:  Proper associated with RPD/PD & O2 Sat Independence with exercise equipment Using PLB without cueing & demonstrates good technique Exercise tolerated well Strength training completed today  Goals Unmet:  Not Applicable  Comments: Pt able to follow exercise prescription today without complaint.  Will continue to monitor for progression.    Dr. Emily Filbert is Medical Director for [place] and [facility].

## 2017-05-13 DIAGNOSIS — J449 Chronic obstructive pulmonary disease, unspecified: Secondary | ICD-10-CM

## 2017-05-13 NOTE — Progress Notes (Signed)
Daily Session Note  Patient Details  Name: Mario Ward MRN: [id number] Date of Birth: [birth year] Referring Provider:     Pulmonary Rehab from 03/01/2017 in [facility] Cardiac and Pulmonary Rehab  Referring Provider  Raul Del      Encounter Date: 05/13/2017  Check In: Session Check In - 05/13/17 1116      Check-In   Location  [facility]-Cardiac & Pulmonary Rehab    Staff Present  Renita Papa, RN BSN;Luella Gardenhire Darrin Nipper, [place], RCEP, [place], Exercise Physiologist    Supervising physician immediately available to respond to emergencies  [facility] immediately available ER MD    Physician(s)  Dr. Alfred Levins and [facility]    Medication changes reported      No    Fall or balance concerns reported     No    Tobacco Cessation  No Change    Warm-up and Cool-down  Performed as group-led instruction    Resistance Training Performed  Yes    VAD Patient?  No      Pain Assessment   Currently in Pain?  No/denies          Social History   Tobacco Use  Smoking Status Former Smoker  . Packs/day: 1.00  . Years: 40.00  . Pack years: 40.00  . Types: Cigarettes  . Last attempt to quit: 09/07/2006  . Years since quitting: 10.6  Smokeless Tobacco Never Used    Goals Met:  Independence with exercise equipment Exercise tolerated well No report of cardiac concerns or symptoms Strength training completed today  Goals Unmet:  Not Applicable  Comments: Pt able to follow exercise prescription today without complaint.  Will continue to monitor for progression.   Dr. Emily Filbert is Medical Director for [place] and [facility].

## 2017-05-16 DIAGNOSIS — J449 Chronic obstructive pulmonary disease, unspecified: Secondary | ICD-10-CM

## 2017-05-16 NOTE — Progress Notes (Signed)
Daily Session Note  Patient Details  Name: Mario Ward MRN: [id number] Date of Birth: 19-Jan-1946 Referring Provider:     Pulmonary Rehab from 03/01/2017 in [facility] Cardiac and Pulmonary Rehab  Referring Provider  Raul Del      Encounter Date: 05/16/2017  Check In: Session Check In - 05/16/17 1121      Check-In   Location  [facility]-Cardiac & Pulmonary Rehab    Staff Present  Earlean Shawl, BS, ACSM CEP, Exercise Physiologist;Amanda Oletta Darter, BA, ACSM CEP, Exercise Physiologist;Antwon Rochin Flavia Shipper    Supervising physician immediately available to respond to emergencies  [facility] immediately available ER MD    Physician(s)  Dr. Joni Fears and Burlene Arnt    Medication changes reported      No    Fall or balance concerns reported     No    Tobacco Cessation  No Change    Warm-up and Cool-down  Performed as group-led instruction    Resistance Training Performed  Yes    VAD Patient?  No      Pain Assessment   Currently in Pain?  No/denies          Social History   Tobacco Use  Smoking Status Former Smoker  . Packs/day: 1.00  . Years: 40.00  . Pack years: 40.00  . Types: Cigarettes  . Last attempt to quit: 09/07/2006  . Years since quitting: 10.6  Smokeless Tobacco Never Used    Goals Met:  Independence with exercise equipment Exercise tolerated well No report of cardiac concerns or symptoms Strength training completed today  Goals Unmet:  Not Applicable  Comments: Pt able to follow exercise prescription today without complaint.  Will continue to monitor for progression.   Dr. Emily Filbert is Medical Director for [place] and [facility].

## 2017-05-19 ENCOUNTER — Institutional Professional Consult (permissible substitution): Payer: Medicare Other | Admitting: Pulmonary Disease

## 2017-05-19 ENCOUNTER — Institutional Professional Consult (permissible substitution): Payer: Medicare Other | Admitting: Internal Medicine

## 2017-05-20 ENCOUNTER — Encounter: Payer: Medicare Other | Admitting: *Deleted

## 2017-05-20 DIAGNOSIS — J449 Chronic obstructive pulmonary disease, unspecified: Secondary | ICD-10-CM | POA: Diagnosis not present

## 2017-05-20 NOTE — Progress Notes (Signed)
Daily Session Note  Patient Details  Name: Mario Ward MRN: [id number] Date of Birth: 11/05/1945 Referring Provider:     Pulmonary Rehab from 03/01/2017 in [facility] Cardiac and Pulmonary Rehab  Referring Provider  Raul Del      Encounter Date: 05/20/2017  Check In: Session Check In - 05/20/17 1129      Check-In   Location  [facility]Cardiac & Pulmonary Rehab    Staff Present  Renita Papa, RN BSN;Myrle Wanek Luan Pulling, MA, RCEP, CCRP, Exercise Physiologist;Mandi Zachery Conch, BS, [place] physician immediately available to respond to emergencies  [facility] immediately available ER MD    Physician(s)  Drs. Owens Shark and [facility]    Medication changes reported      No    Fall or balance concerns reported     No    Warm-up and Cool-down  Performed as group-led Higher education careers adviser Performed  Yes    VAD Patient?  No      Pain Assessment   Currently in Pain?  No/denies          Social History   Tobacco Use  Smoking Status Former Smoker  . Packs/day: 1.00  . Years: 40.00  . Pack years: 40.00  . Types: Cigarettes  . Last attempt to quit: 09/07/2006  . Years since quitting: 10.7  Smokeless Tobacco Never Used    Goals Met:  Proper associated with RPD/PD & O2 Sat Independence with exercise equipment Using PLB without cueing & demonstrates good technique Exercise tolerated well No report of cardiac concerns or symptoms Strength training completed today  Goals Unmet:  Not Applicable  Comments: Pt able to follow exercise prescription today without complaint.  Will continue to monitor for progression.    Dr. Emily Filbert is Medical Director for [place] and [facility].

## 2017-05-23 DIAGNOSIS — J449 Chronic obstructive pulmonary disease, unspecified: Secondary | ICD-10-CM

## 2017-05-23 NOTE — Progress Notes (Signed)
Daily Session Note  Patient Details  Name: Nedim Oki MRN: [id number] Date of Birth: 1945-02-17 Referring Provider:     Pulmonary Rehab from 03/01/2017 in [facility] Cardiac and Pulmonary Rehab  Referring Provider  Raul Del      Encounter Date: 05/23/2017  Check In: Session Check In - 05/23/17 1138      Check-In   Location  [facility]-Cardiac & Pulmonary Rehab    Staff Present  Justin Mend RCP,RRT,BSRT;Mandi [place], BS, Dennis Bast, BS, ACSM CEP, Exercise Physiologist    Supervising physician immediately available to respond to emergencies  [facility] immediately available ER MD    Physician(s)  Dr. Reita Cliche and Cinda Quest    Medication changes reported      No    Fall or balance concerns reported     No    Tobacco Cessation  No Change    Warm-up and Cool-down  Performed as group-led instruction    Resistance Training Performed  Yes    VAD Patient?  No      Pain Assessment   Currently in Pain?  No/denies          Social History   Tobacco Use  Smoking Status Former Smoker  . Packs/day: 1.00  . Years: 40.00  . Pack years: 40.00  . Types: Cigarettes  . Last attempt to quit: 09/07/2006  . Years since quitting: 10.7  Smokeless Tobacco Never Used    Goals Met:  Independence with exercise equipment Exercise tolerated well No report of cardiac concerns or symptoms Strength training completed today  Goals Unmet:  Not Applicable  Comments: Pt able to follow exercise prescription today without complaint.  Will continue to monitor for progression.   Dr. Emily Filbert is Medical Director for [place] and [facility].

## 2017-05-30 DIAGNOSIS — J449 Chronic obstructive pulmonary disease, unspecified: Secondary | ICD-10-CM | POA: Diagnosis not present

## 2017-05-30 NOTE — Progress Notes (Signed)
Daily Session Note  Patient Details  Name: Mario Ward MRN: [id number] Date of Birth: [birth year] Referring Provider:     Pulmonary Rehab from 03/01/2017 in [facility] Cardiac and Pulmonary Rehab  Referring Provider  Raul Del      Encounter Date: 05/30/2017  Check In: Session Check In - 05/30/17 1140      Check-In   Location  [facility]-Cardiac & Pulmonary Rehab    Staff Present  Earlean Shawl, BS, ACSM CEP, Exercise Physiologist;Laureen Owens Shark, BS, RRT, Respiratory Therapist;Antwine Agosto [place] physician immediately available to respond to emergencies  [facility] immediately available ER MD    Physician(s)  Dr. Corky Downs and Alfred Levins    Medication changes reported      No    Fall or balance concerns reported     No    Tobacco Cessation  No Change    Warm-up and Cool-down  Performed as group-led instruction    Resistance Training Performed  Yes    VAD Patient?  No      Pain Assessment   Currently in Pain?  No/denies          Social History   Tobacco Use  Smoking Status Former Smoker  . Packs/day: 1.00  . Years: 40.00  . Pack years: 40.00  . Types: Cigarettes  . Last attempt to quit: 09/07/2006  . Years since quitting: 10.7  Smokeless Tobacco Never Used    Goals Met:  Independence with exercise equipment Exercise tolerated well No report of cardiac concerns or symptoms Strength training completed today  Goals Unmet:  Not Applicable  Comments: Pt able to follow exercise prescription today without complaint.  Will continue to monitor for progression.   Dr. Emily Filbert is Medical Director for [place] and [facility].

## 2017-06-03 DIAGNOSIS — J449 Chronic obstructive pulmonary disease, unspecified: Secondary | ICD-10-CM

## 2017-06-03 NOTE — Progress Notes (Signed)
Pulmonary Individual Treatment Plan  Patient Details  Name: Mario Ward MRN: [id number] Date of Birth: December 29, 1945 Referring Provider:     Pulmonary Rehab from 03/01/2017 in [facility] Cardiac and Pulmonary Rehab  Referring Provider  Raul Del      Initial Encounter Date:    Pulmonary Rehab from 03/01/2017 in [facility] Cardiac and Pulmonary Rehab  Date  03/01/17  Referring Provider  Raul Del      Visit Diagnosis: Chronic obstructive pulmonary disease, unspecified COPD type ([place])  Patient's Home Medications on Admission:  Current Outpatient Medications:  .  albuterol (PROVENTIL) (2.5 MG/3ML) 0.083% nebulizer solution, Take 2.5 mg by nebulization every 2 (two) hours as needed for wheezing or shortness of breath., Disp: , Rfl:  .  aspirin EC 81 MG tablet, Take 81 mg by mouth daily., Disp: , Rfl:  .  cholecalciferol (VITAMIN D) 1000 UNITS tablet, Take 2,000 Units by mouth daily., Disp: , Rfl:  .  clopidogrel (PLAVIX) 75 MG tablet, Take 75 mg by mouth daily., Disp: , Rfl:  .  lisinopril (PRINIVIL,ZESTRIL) 40 MG tablet, Take 40 mg by mouth daily., Disp: , Rfl:  .  metoprolol tartrate (LOPRESSOR) 25 MG tablet, Take 25 mg by mouth 2 (two) times daily. , Disp: , Rfl:  .  Multiple Vitamin (MULTIVITAMIN) tablet, Take 1 tablet by mouth daily., Disp: , Rfl:  .  nitroGLYCERIN (NITROSTAT) 0.4 MG SL tablet, Place 0.4 mg under the tongue every 5 (five) minutes as needed for chest pain., Disp: , Rfl:  .  Olopatadine HCl (PATADAY) 0.2 % SOLN, Apply 2 drops to eye as needed. , Disp: , Rfl:  .  RABEprazole (ACIPHEX) 20 MG tablet, TAKE 1 TABLET DAILY 45 MINUTES BEFORE BREAKFAST ON AN EMPTY STOMACH, Disp: , Rfl:  .  simvastatin (ZOCOR) 40 MG tablet, TAKE 1 TABLET DAILY, Disp: 90 tablet, Rfl: 3 .  SPIRIVA HANDIHALER 18 MCG inhalation capsule, , Disp: , Rfl:  .  tamsulosin (FLOMAX) 0.4 MG CAPS capsule, Take 1 capsule (0.4 mg total) by mouth daily., Disp: 90 capsule, Rfl: 3 .  vitamin B-12 (CYANOCOBALAMIN) 1000 MCG  tablet, Take 1,000 mcg by mouth daily., Disp: , Rfl:   Past Medical History: Past Medical History:  Diagnosis Date  . AAA (abdominal aortic aneurysm) ([place])   . Anginal pain ([place])   . Arthritis   . COPD (chronic obstructive pulmonary disease) ([place])   . Coronary artery disease 1997   CABG x 4   . 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  . 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 GERD (gastroesophageal reflux disease)   . Hypertension   . Sleep apnea    uses a CPAP at night.   . Vitamin D deficiency     Tobacco Use: Social History   Tobacco Use  Smoking Status Former Smoker  . Packs/day: 1.00  . Years: 40.00  . Pack years: 40.00  . Types: Cigarettes  . Last attempt to quit: 09/07/2006  . Years since quitting: 10.7  Smokeless Tobacco Never Used    Labs: Recent Review Flowsheet Data    There is no flowsheet data to display.       Pulmonary Assessment Scores: Pulmonary Assessment Scores    Row Name 03/01/17 1115 05/11/17 1236       ADL UCSD   ADL Phase  Entry  -    SOB Score total  31  -    Rest  0  -  Walk  2  -    Stairs  3  -    Bath  2  -    Dress  2  -    Shop  3  -      CAT Score   CAT Score  12  -      mMRC Score   mMRC Score  1  -       Pulmonary Function Assessment: Pulmonary Function Assessment - 03/01/17 1133      Initial Spirometry Results   FVC%  57 %    FEV1%  57 %    FEV1/FVC Ratio  72.66    Comments  good patient effort      Post Bronchodilator Spirometry Results   FVC%  61.56 %    FEV1%  61.56 %    FEV1/FVC Ratio  72.79    Comments  good patient effort      Breath   Bilateral Breath Sounds  Clear    Shortness of Breath  No       Exercise Target Goals:    Exercise Program Goal: Individual exercise prescription set using results from initial 6 min walk test and THRR while considering  patient's activity barriers and safety.    Exercise Prescription  Goal: Initial exercise prescription builds to 30-45 minutes a day of aerobic activity, 2-3 days per week.  Home exercise guidelines will be given to patient during program as part of exercise prescription that the participant will acknowledge.  Activity Barriers & Risk Stratification:   6 Minute Walk: 6 Minute Walk    Row Name 03/01/17 1300 03/01/17 1304       6 Minute Walk   Distance  1200 feet  -    Walk Time  6 minutes  -    # of Rest Breaks  0  -    MPH  2.27  -    METS  2.29  -    RPE  11  -    Perceived Dyspnea   2  -    VO2 Peak  8.01  -    Symptoms  No  -    Resting HR  66 bpm  -    Resting BP  136/76  -    Resting Oxygen Saturation   94 %  -    Exercise Oxygen Saturation  during 6 min walk  88 %  -    Max Ex. HR  95 bpm  -    Max Ex. BP  192/78  -    2 Minute Post BP  166/82  -      Interval HR   1 Minute HR  -  82    2 Minute HR  -  84    3 Minute HR  -  86    4 Minute HR  -  85    6 Minute HR  -  95    2 Minute Post HR  -  69    Interval Heart Rate?  -  Yes      Interval Oxygen   Interval Oxygen?  -  Yes    Baseline Oxygen Saturation %  -  94 %    1 Minute Oxygen Saturation %  -  95 %    1 Minute Liters of Oxygen  -  0 L    2 Minute Oxygen Saturation %  -  89 %    2 Minute Liters of Oxygen  -  0 L    3 Minute Oxygen Saturation %  -  88 %    3 Minute Liters of Oxygen  -  0 L    4 Minute Oxygen Saturation %  -  92 %    4 Minute Liters of Oxygen  -  0 L    5 Minute Liters of Oxygen  -  0 L    6 Minute Oxygen Saturation %  -  95 %    6 Minute Liters of Oxygen  -  0 L    2 Minute Post Oxygen Saturation %  -  97 %    2 Minute Post Liters of Oxygen  -  0 L      Oxygen Initial Assessment: Oxygen Initial Assessment - 03/01/17 1130      Home Oxygen   Home Oxygen Device  None    Sleep Oxygen Prescription  CPAP    Liters per minute  0    Home Exercise Oxygen Prescription  None    Home at Rest Exercise Oxygen Prescription  None    Compliance with Home  Oxygen Use  Yes      Initial 6 min Walk   Oxygen Used  None      Program Oxygen Prescription   Program Oxygen Prescription  None      Intervention   Short Term Goals  To learn and demonstrate proper use of respiratory medications;To learn and demonstrate proper pursed lip breathing techniques or other breathing techniques.;To learn and understand importance of maintaining oxygen saturations>88%;To learn and understand importance of monitoring SPO2 with pulse oximeter and demonstrate accurate use of the pulse oximeter.    Long  Term Goals  Verbalizes importance of monitoring SPO2 with pulse oximeter and return demonstration;Maintenance of O2 saturations>88%;Compliance with respiratory medication;Exhibits proper breathing techniques, such as pursed lip breathing or other method taught during program session;Demonstrates proper use of MDI's       Oxygen Re-Evaluation: Oxygen Re-Evaluation    Row Name 03/28/17 1149 04/25/17 1206 05/20/17 1203         Program Oxygen Prescription   Program Oxygen Prescription  None  None  None       Home Oxygen   Home Oxygen Device  None  None  None     Sleep Oxygen Prescription  CPAP  CPAP  CPAP     Liters per minute  0  0  0     Home Exercise Oxygen Prescription  None  None  None     Home at Rest Exercise Oxygen Prescription  None  None  None     Compliance with Home Oxygen Use  Yes  Yes  Yes       Goals/Expected Outcomes   Short Term Goals  To learn and demonstrate proper use of respiratory medications;To learn and demonstrate proper pursed lip breathing techniques or other breathing techniques.;To learn and understand importance of maintaining oxygen saturations>88%;To learn and understand importance of monitoring SPO2 with pulse oximeter and demonstrate accurate use of the pulse oximeter.  To learn and demonstrate proper use of respiratory medications;To learn and demonstrate proper pursed lip breathing techniques or other breathing techniques.;To  learn and understand importance of maintaining oxygen saturations>88%;To learn and understand importance of monitoring SPO2 with pulse oximeter and demonstrate accurate use of the pulse oximeter.  To learn and demonstrate proper use of respiratory medications;To learn and demonstrate proper pursed lip breathing techniques or other breathing techniques.;To learn and understand importance of maintaining  oxygen saturations>88%;To learn and understand importance of monitoring SPO2 with pulse oximeter and demonstrate accurate use of the pulse oximeter.     Long  Term Goals  Verbalizes importance of monitoring SPO2 with pulse oximeter and return demonstration;Maintenance of O2 saturations>88%;Compliance with respiratory medication;Exhibits proper breathing techniques, such as pursed lip breathing or other method taught during program session;Demonstrates proper use of MDI's  Verbalizes importance of monitoring SPO2 with pulse oximeter and return demonstration;Maintenance of O2 saturations>88%;Compliance with respiratory medication;Exhibits proper breathing techniques, such as pursed lip breathing or other method taught during program session;Demonstrates proper use of MDI's  Verbalizes importance of monitoring SPO2 with pulse oximeter and return demonstration;Maintenance of O2 saturations>88%;Compliance with respiratory medication;Exhibits proper breathing techniques, such as pursed lip breathing or other method taught during program session;Demonstrates proper use of MDI's     Comments  Reviewed PLB technique with pt.  Talked about how it work and it's important to maintaining his exercise saturations.   Mario Ward takes his Spiriva every as prescribed. He states that he does not carry his inhaler with him at all times. Informed him that it would be a good idea to have it on him just in case he becomes short of breath.  Mario Ward takes his medications as he should. He is compliant with his CPAP. He is starting to carry his  inhaler with him at all times. We talked about when to use it.     Goals/Expected Outcomes  Short: Become more profiecient at using PLB.   Long: Become independent at using PLB.  Short: carry rescue inhaler more frequently. Long: independently carry rescue inhaler at all times.  Short: continue to carry inhaler. Long: continue to independently manage his breathing medications         Oxygen Discharge (Final Oxygen Re-Evaluation): Oxygen Re-Evaluation - 05/20/17 1203      Program Oxygen Prescription   Program Oxygen Prescription  None      Home Oxygen   Home Oxygen Device  None    Sleep Oxygen Prescription  CPAP    Liters per minute  0    Home Exercise Oxygen Prescription  None    Home at Rest Exercise Oxygen Prescription  None    Compliance with Home Oxygen Use  Yes      Goals/Expected Outcomes   Short Term Goals  To learn and demonstrate proper use of respiratory medications;To learn and demonstrate proper pursed lip breathing techniques or other breathing techniques.;To learn and understand importance of maintaining oxygen saturations>88%;To learn and understand importance of monitoring SPO2 with pulse oximeter and demonstrate accurate use of the pulse oximeter.    Long  Term Goals  Verbalizes importance of monitoring SPO2 with pulse oximeter and return demonstration;Maintenance of O2 saturations>88%;Compliance with respiratory medication;Exhibits proper breathing techniques, such as pursed lip breathing or other method taught during program session;Demonstrates proper use of MDI's    Comments  Mcclellan takes his medications as he should. He is compliant with his CPAP. He is starting to carry his inhaler with him at all times. We talked about when to use it.    Goals/Expected Outcomes  Short: continue to carry inhaler. Long: continue to independently manage his breathing medications        Initial Exercise Prescription: Initial Exercise Prescription - 03/01/17 1300      Date of  Initial Exercise RX and Referring Provider   Date  03/01/17    Referring Provider  Raul Del      Treadmill   MPH  2  Grade  0    Minutes  15    METs  2.5      NuStep   Level  2    SPM  80    Minutes  15    METs  2.2      REL-XR   Level  2    Speed  50    Minutes  15    METs  2.2      Biostep-RELP   Level  3    SPM  50    Minutes  15    METs  2      Prescription Details   Frequency (times per week)  3    Duration  Progress to 45 minutes of aerobic exercise without signs/symptoms of physical distress      Intensity   THRR 40-80% of Max Heartrate  98-130    Ratings of Perceived Exertion  11-13    Perceived Dyspnea  0-4      Resistance Training   Training Prescription  Yes    Weight  3 lb    Reps  10-15       Perform Capillary Blood Glucose checks as needed.  Exercise Prescription Changes: Exercise Prescription Changes    Row Name 03/28/17 1600 04/04/17 1200 04/12/17 1600 04/26/17 1400 05/09/17 1500     Response to Exercise   Blood Pressure (Admit)  142/86  -  136/62  142/80  124/74   Blood Pressure (Exercise)  152/74  -  130/74  -  -   Blood Pressure (Exit)  134/68  -  126/66  140/82  130/84   Heart Rate (Admit)  72 bpm  -  82 bpm  81 bpm  88 bpm   Heart Rate (Exercise)  96 bpm  -  105 bpm  85 bpm  117 bpm   Heart Rate (Exit)  93 bpm  -  90 bpm  78 bpm  82 bpm   Oxygen Saturation (Admit)  94 %  -  94 %  92 %  91 %   Oxygen Saturation (Exercise)  92 %  -  92 %  94 %  94 %   Oxygen Saturation (Exit)  94 %  -  95 %  94 %  94 %   Rating of Perceived Exertion (Exercise)  13  -  _0 Perceived Dyspnea (Exercise)  2  -  _1 Symptoms  -  -  none  none  none   Comments  First full day of exercise  -  -  -  -   Duration  Continue with 45 min of aerobic exercise without signs/symptoms of physical distress.  -  Continue with 45 min of aerobic exercise without signs/symptoms of physical distress.  Continue with 45 min of aerobic exercise without  signs/symptoms of physical distress.  Continue with 45 min of aerobic exercise without signs/symptoms of physical distress.   Intensity  THRR unchanged  -  THRR unchanged  THRR unchanged  THRR unchanged     Progression   Progression  Continue to progress workloads to maintain intensity without signs/symptoms of physical distress.  -  Continue to progress workloads to maintain intensity without signs/symptoms of physical distress.  Continue to progress workloads to maintain intensity without signs/symptoms of physical distress.  Continue to progress workloads to maintain intensity without signs/symptoms of physical distress.   Average METs  2.51  -  2.57  2.79  3.14     Resistance Training   Training Prescription  Yes  -  Yes  Yes  Yes   Weight  3 lb  -  6 lbs  10 lbs  8 lbs   Reps  10-15  -  10-15  10-15  10-15     Interval Training   Interval Training  No  -  No  No  No     Oxygen   Oxygen  -  -  -  -  -   Liters  -  -  -  -  -     Treadmill   MPH  2  -  2  2.3  2.5   Grade  0  -  0  1  1.5   Minutes  15  -  _0 METs  2.53  -  2.53  3.08  3.43     NuStep   Level  2  -  _1 SPM  94  -  89  84  79   Minutes  15  -  _2 METs  2.6  -  2.7  2.9  3.4     Recumbant Elliptical   Level  -  -  -  -  -   RPM  -  -  -  -  -   Minutes  -  -  -  -  -   METs  -  -  -  -  -     REL-XR   Level  2  -  _3 Speed  52  -  54  48  50   Minutes  15  -  _4 METs  2.4  -  2.3  2.4  2.6     T5 Nustep   Level  -  -  -  -  -   SPM  -  -  -  -  -   Minutes  -  -  -  -  -   METs  -  -  -  -  -     Home Exercise Plan   Plans to continue exercise at  -  Home (comment) walking, treadmill, recumbent bike  Home (comment) walking, treadmill, recumbent bike  Home (comment) walking, treadmill, recumbent bike  Home (comment) walking, treadmill, recumbent bike   Frequency  -  Add 2 additional days to program exercise sessions.  Add 2 additional days to program  exercise sessions.  Add 2 additional days to program exercise sessions.  Add 2 additional days to program exercise sessions.   Initial Home Exercises Provided  -  04/04/17  04/04/17  04/04/17  04/04/17   Row Name 05/25/17 1400             Response to Exercise   Blood Pressure (Admit)  124/70       Blood Pressure (Exit)  132/78       Heart Rate (Admit)  68 bpm       Heart Rate (Exercise)  98 bpm       Heart Rate (Exit)  76 bpm       Oxygen Saturation (Admit)  92 %       Oxygen Saturation (Exercise)  92 %  Oxygen Saturation (Exit)  94 %       Rating of Perceived Exertion (Exercise)  11       Perceived Dyspnea (Exercise)  2       Symptoms  none       Duration  Continue with 45 min of aerobic exercise without signs/symptoms of physical distress.       Intensity  THRR unchanged         Progression   Progression  Continue to progress workloads to maintain intensity without signs/symptoms of physical distress.       Average METs  3.34         Resistance Training   Training Prescription  Yes       Weight  8 lbs       Reps  10-15         Interval Training   Interval Training  No         Treadmill   MPH  2.5       Grade  1.5       Minutes  15       METs  3.43         NuStep   Level  4       SPM  96       Minutes  15       METs  3.8         REL-XR   Level  8       Minutes  15       METs  2.8         Home Exercise Plan   Plans to continue exercise at  Home (comment) walking, treadmill, recumbent bike       Frequency  Add 2 additional days to program exercise sessions.       Initial Home Exercises Provided  04/04/17          Exercise Comments: Exercise Comments    Row Name 03/28/17 1149           Exercise Comments  First full day of exercise!  Patient was oriented to gym and equipment including functions, settings, policies, and procedures.  Patient's individual exercise prescription and treatment plan were reviewed.  All starting workloads were established  based on the results of the 6 minute walk test done at initial orientation visit.  The plan for exercise progression was also introduced and progression will be customized based on patient's performance and goals.          Exercise Goals and Review: Exercise Goals    Row Name 03/01/17 1259             Exercise Goals   Increase Physical Activity  Yes       Intervention  Provide advice, education, support and counseling about physical activity/exercise needs.;Develop an individualized exercise prescription for aerobic and resistive training based on initial evaluation findings, risk stratification, comorbidities and participant's personal goals.       Expected Outcomes  Short Term: Attend rehab on a regular basis to increase amount of physical activity.;Long Term: Add in home exercise to make exercise part of routine and to increase amount of physical activity.;Long Term: Exercising regularly at least 3-5 days a week.       Increase Strength and Stamina  Yes       Intervention  Provide advice, education, support and counseling about physical activity/exercise needs.;Develop an individualized exercise prescription for aerobic and resistive training based on initial  evaluation findings, risk stratification, comorbidities and participant's personal goals.       Expected Outcomes  Short Term: Increase workloads from initial exercise prescription for resistance, speed, and METs.;Short Term: Perform resistance training exercises routinely during rehab and add in resistance training at home;Long Term: Improve cardiorespiratory fitness, muscular endurance and strength as measured by increased METs and functional capacity (6MWT)       Able to understand and use rate of perceived exertion (RPE) scale  Yes       Intervention  Provide education and explanation on how to use RPE scale       Expected Outcomes  Short Term: Able to use RPE daily in rehab to express subjective intensity level;Long Term:  Able to  use RPE to guide intensity level when exercising independently       Able to understand and use Dyspnea scale  Yes       Intervention  Provide education and explanation on how to use Dyspnea scale       Expected Outcomes  Short Term: Able to use Dyspnea scale daily in rehab to express subjective sense of shortness of breath during exertion;Long Term: Able to use Dyspnea scale to guide intensity level when exercising independently       Knowledge and understanding of Target Heart Rate Range (THRR)  Yes       Intervention  Provide education and explanation of THRR including how the numbers were predicted and where they are located for reference       Expected Outcomes  Short Term: Able to state/look up THRR;Long Term: Able to use THRR to govern intensity when exercising independently;Short Term: Able to use daily as guideline for intensity in rehab       Able to check pulse independently  Yes       Intervention  Provide education and demonstration on how to check pulse in carotid and radial arteries.;Review the importance of being able to check your own pulse for safety during independent exercise       Expected Outcomes  Short Term: Able to explain why pulse checking is important during independent exercise;Long Term: Able to check pulse independently and accurately       Understanding of Exercise Prescription  Yes       Intervention  Provide education, explanation, and written materials on patient's individual exercise prescription       Expected Outcomes  Short Term: Able to explain program exercise prescription;Long Term: Able to explain home exercise prescription to exercise independently          Exercise Goals Re-Evaluation : Exercise Goals Re-Evaluation    Row Name 03/28/17 1149 04/04/17 1240 04/12/17 1610 04/26/17 1457 05/09/17 1512     Exercise Goal Re-Evaluation   Exercise Goals Review  Understanding of Exercise Prescription;Able to understand and use Dyspnea scale;Knowledge and  understanding of Target Heart Rate Range (THRR);Able to understand and use rate of perceived exertion (RPE) scale  Understanding of Exercise Prescription;Able to understand and use Dyspnea scale;Knowledge and understanding of Target Heart Rate Range (THRR);Able to understand and use rate of perceived exertion (RPE) scale;Increase Physical Activity;Able to check pulse independently  Increase Physical Activity;Understanding of Exercise Prescription;Increase Strength and Stamina  Increase Physical Activity;Understanding of Exercise Prescription;Increase Strength and Stamina  Increase Physical Activity;Understanding of Exercise Prescription;Increase Strength and Stamina   Comments  Reviewed RPE scale, THR and program prescription with pt today.  Pt voiced understanding and was given a copy of goals to take home.  Reviewed home exercise with pt today.  Pt plans to walking and using treadmill and recumbent bike at home for exercise.  Reviewed THR, pulse, RPE, sign and symptoms, and when to call 911 or MD.  Also discussed weather considerations and indoor options.  Pt voiced understanding.  Mario Ward has been doing well in rehab.  He is now up to level 3 on the XR and level 4 on NuStep.  We will continue to monitor his progression.   Mario Ward continues to do well in rehab.  He has added a 1% grade to his treadmill and is up to level 4 on the XR.   He has also increased his weights to 10 lbs.  We will continue to monitor his progression.   Mario Ward has been doing well in rehab.  He is now up to level 6 on the XR. He still needs to work on increasing his METs on the XR.  We will continue to monitor his progression.    Expected Outcomes  Short: Use RPE daily to regulate intensity.  Long: Follow program prescription in THR.  Short: Continue to exercise by adding in one extra day a week for 30 min routinely.  Long: Continue to exercise regularly to increase strength and stamina.   Short: Add incline on treadmill.   Long: Continue to  exercise on off days.   Short: Work on increasing METs on the XR.  Long: Continue to build strength and stamina.   Short: Continue to work on increasing METs.  Long: Continue to work on increasing physical activity   [place] Name 05/20/17 1209 05/25/17 1415           Exercise Goal Re-Evaluation   Exercise Goals Review  -  Increase Physical Activity;Understanding of Exercise Prescription;Increase Strength and Stamina      Comments  Mario Ward has been doing his home exercise. He is walking on his treadmill and keeping track of all his steps. He likes it on his phone because it movitaves him to meet his goals.  Mario Ward has been doing well in rehab.  He is up to level 8 on the XR! We will continue to track his progress.      Expected Outcomes  Short: continue to walk at home on a regular basis. Long: become indpenedent at exercising post [facility].   Short: Continue to walk on off days from rehab.  Long: Continue to build strength and stamina         Discharge Exercise Prescription (Final Exercise Prescription Changes): Exercise Prescription Changes - 05/25/17 1400      Response to Exercise   Blood Pressure (Admit)  124/70    Blood Pressure (Exit)  132/78    Heart Rate (Admit)  68 bpm    Heart Rate (Exercise)  98 bpm    Heart Rate (Exit)  76 bpm    Oxygen Saturation (Admit)  92 %    Oxygen Saturation (Exercise)  92 %    Oxygen Saturation (Exit)  94 %    Rating of Perceived Exertion (Exercise)  11    Perceived Dyspnea (Exercise)  2    Symptoms  none    Duration  Continue with 45 min of aerobic exercise without signs/symptoms of physical distress.    Intensity  THRR unchanged      Progression   Progression  Continue to progress workloads to maintain intensity without signs/symptoms of physical distress.    Average METs  3.34      Resistance Training  Training Prescription  Yes    Weight  8 lbs    Reps  10-15      Interval Training   Interval Training  No      Treadmill   MPH  2.5    Grade   1.5    Minutes  15    METs  3.43      NuStep   Level  4    SPM  96    Minutes  15    METs  3.8      REL-XR   Level  8    Minutes  15    METs  2.8      Home Exercise Plan   Plans to continue exercise at  Home (comment) walking, treadmill, recumbent bike    Frequency  Add 2 additional days to program exercise sessions.    Initial Home Exercises Provided  04/04/17       Nutrition:  Target Goals: Understanding of nutrition guidelines, daily intake of sodium <1538m, cholesterol <2024m calories 30% from fat and 7% or less from saturated fats, daily to have 5 or more servings of fruits and vegetables.  Biometrics: Pre Biometrics - 03/01/17 1259      Pre Biometrics   Height  _0  (1.702 m)    Weight  247 lb 6.4 oz (112.2 kg)    Waist Circumference  45 inches    Hip Circumference  48 inches    Waist to Hip Ratio  0.94 %    BMI (Calculated)  38.74        Nutrition Therapy Plan and Nutrition Goals: Nutrition Therapy & Goals - 03/01/17 1113      Personal Nutrition Goals   Comments  Weight loss. He would like to meet with the dietician.      Intervention Plan   Intervention  Prescribe, educate and counsel regarding individualized specific dietary modifications aiming towards targeted core components such as weight, hypertension, lipid management, diabetes, heart failure and other comorbidities.;Nutrition handout(s) given to patient.    Expected Outcomes  Short Term Goal: Understand basic principles of dietary content, such as calories, fat, sodium, cholesterol and nutrients.;Long Term Goal: Adherence to prescribed nutrition plan.       Nutrition Assessments: Nutrition Assessments - 03/01/17 1113      MEDFICTS Scores   Pre Score  66       Nutrition Goals Re-Evaluation: Nutrition Goals Re-Evaluation    Row Name 04/04/17 1216 04/25/17 1210 05/20/17 1145         Goals   Current Weight  -  246 lb (111.6 kg)  -     Nutrition Goal  meet with dietician 3/5  Lose  weight and try to count calories.  Continue to lose weight and count calories/ carbs while maintaining portion control     Comment  Discuss weight loss and diet tips  He is supposed to try to eat 1800-2000 calories a day. Mario Ward uses an app to count his calories, but is just getting into the habit of logging his calories. He drinks water of flavored water.  His portion control could use work.  Mario Ward his wife are working on portion control. They are starting to freeze meals to help with portion control. They are trying to keep carbs low as well as fat.      Expected Outcome  Short: Attend appt.  Long: Work on dieting and working on weigh loss and following recommendations.   Short: cut back on  portions. Long: lose weight by cutting back on portions.  Short: stay below 2000 calories and watch carb intake . Long: get to goal weight.         Nutrition Goals Discharge (Final Nutrition Goals Re-Evaluation): Nutrition Goals Re-Evaluation - 05/20/17 1145      Goals   Nutrition Goal  Continue to lose weight and count calories/ carbs while maintaining portion control    Comment  Mario Ward and his wife are working on portion control. They are starting to freeze meals to help with portion control. They are trying to keep carbs low as well as fat.     Expected Outcome  Short: stay below 2000 calories and watch carb intake . Long: get to goal weight.        Psychosocial: Target Goals: Acknowledge presence or absence of significant depression and/or stress, maximize coping skills, provide positive support system. Participant is able to verbalize types and ability to use techniques and skills needed for reducing stress and depression.   Initial Review & Psychosocial Screening: Initial Psych Review & Screening - 03/01/17 1112      Initial Review   Current issues with  None Identified      Family Dynamics   Good Support System?  Yes    Comments  He looks to his wife and kids for support      Barriers    Psychosocial barriers to participate in program  There are no identifiable barriers or psychosocial needs.      Screening Interventions   Interventions  Encouraged to exercise;Program counselor consult;Provide feedback about the scores to participant;To provide support and resources with identified psychosocial needs    Expected Outcomes  Short Term goal: Utilizing psychosocial counselor, staff and physician to assist with identification of specific Stressors or current issues interfering with healing process. Setting desired goal for each stressor or current issue identified.;Long Term Goal: Stressors or current issues are controlled or eliminated.       Quality of Life Scores:  Scores of 19 and below usually indicate a poorer quality of life in these areas.  A difference of  2-3 points is a clinically meaningful difference.  A difference of 2-3 points in the total score of the Quality of Life Index has been associated with significant improvement in overall quality of life, self-image, physical symptoms, and general health in studies assessing change in quality of life.  PHQ-9: Recent Review Flowsheet Data    Depression screen [facility] 2/9 03/01/2017   Decreased Interest 0   Down, Depressed, Hopeless 0   PHQ - 2 Score 0   Altered sleeping 0   Tired, decreased energy 1   Change in appetite 2   Feeling bad or failure about yourself  0   Trouble concentrating 0   Moving slowly or fidgety/restless 0   Suicidal thoughts 0   PHQ-9 Score 3   Difficult doing work/chores Somewhat difficult     Interpretation of Total Score  Total Score Depression Severity:  1-4 = Minimal depression, 5-9 = Mild depression, 10-14 = Moderate depression, 15-19 = Moderately severe depression, 20-27 = Severe depression   Psychosocial Evaluation and Intervention: Psychosocial Evaluation - 04/20/17 1225      Psychosocial Evaluation & Interventions   Interventions  Encouraged to exercise with the program and follow  exercise prescription    Comments  Counselor met with Mr. Pepperman Chuba) today for initial psychosocial evaluation.  He is a 72 year old who has been diagnosed with COPD.  Draylon has a strong support system with a spouse of 102 years and a son who lives here locally.  He moved from [place] in 2014 and enjoys the weather here in [place].  Primus has arthritis; diabetes; and sleep apnea on top of his pulmonary diagnosis.  He reports sleeping better since he uses a CPAP and has a very good appetite.  Deakon denies a history of depression or anxiety or any current symptoms.  He is typically in a positive mood and has minimal stress in his life.  Parsa has goals to lose weight and get in the "habit of exercise" while in this program.  He will be followed with staff.    Expected Outcomes  Nyaire will benefit from consistent exercise to achieve his stated goals.  The educational and psychoeducational components of this program will be helpful in understanding and managing his condition more positively.  ST goal - to meet with the dietician to address his weight loss goal.  LT - to attend and exercise consistently to get in the habit of this healthy practice.      Continue Psychosocial Services   Follow up required by staff       Psychosocial Re-Evaluation: Psychosocial Re-Evaluation    Isabel Name 04/04/17 1217 04/25/17 1217 05/20/17 1157         Psychosocial Re-Evaluation   Current issues with  None Identified  None Identified  None Identified     Comments  Mario Ward continues to do well mentally.  He has no major stressors in his life and has a strong support system.  He also sleeps well at night as long as he use his CPAP.  He is enjoying the exercise as part of the program.  Mario Ward states he has no stresses at home, does not work and everything is good.  Mario Ward is feeling good and reports no stress. He is selling his house and potentially moving to [place] which is closer to his son. He is excited for new  changes. He wants to continue to exercise.      Expected Outcomes  Short: Continue to attend rehab regualrly.  Long: Continue to maintain postivie attitude.   Short: Continue to attend rehab regualrly.  Long: Continue to maintain postivie attitude.   Short: continue to come to [facility] and exercise regularly. Long: find an exercise routine that works for him and his wife to keep motivated     Interventions  Encouraged to attend Cardiac Rehabilitation for the exercise;Stress management education  Encouraged to attend Pulmonary Rehabilitation for the exercise  -     Continue Psychosocial Services   Follow up required by staff  Follow up required by staff  -        Psychosocial Discharge (Final Psychosocial Re-Evaluation): Psychosocial Re-Evaluation - 05/20/17 1157      Psychosocial Re-Evaluation   Current issues with  None Identified    Comments  Mario Ward is feeling good and reports no stress. He is selling his house and potentially moving to [place] which is closer to his son. He is excited for new changes. He wants to continue to exercise.     Expected Outcomes  Short: continue to come to [facility] and exercise regularly. Long: find an exercise routine that works for him and his wife to keep motivated       Education: Education Goals: Education classes will be provided on a weekly basis, covering required topics. Participant will state understanding/return demonstration of topics presented.  Learning Barriers/Preferences: Learning Barriers/Preferences -  03/01/17 1117      Learning Barriers/Preferences   Learning Barriers  Sight wears glasses    Learning Preferences  None       Education Topics:  Initial Evaluation Education: - Verbal, written and demonstration of respiratory meds, oximetry and breathing techniques. Instruction on use of nebulizers and MDIs and importance of monitoring MDI activations.   Pulmonary Rehab from 05/13/2017 in [facility] Cardiac and Pulmonary Rehab  Date  03/01/17   Educator  [facility]  Instruction Review Code  1- Verbalizes Understanding      General Nutrition Guidelines/Fats and Fiber: -Group instruction provided by verbal, written material, models and posters to present the general guidelines for heart healthy nutrition. Gives an explanation and review of dietary fats and fiber.   Pulmonary Rehab from 05/13/2017 in [facility] Cardiac and Pulmonary Rehab  Date  04/25/17  Educator  CR  Instruction Review Code  1- Verbalizes Understanding      Controlling Sodium/Reading Food Labels: -Group verbal and written material supporting the discussion of sodium use in heart healthy nutrition. Review and explanation with models, verbal and written materials for utilization of the food label.   Pulmonary Rehab from 05/13/2017 in [facility] Cardiac and Pulmonary Rehab  Date  05/02/17  Educator  CR  Instruction Review Code  1- Verbalizes Understanding      Exercise Physiology & General Exercise Guidelines: - Group verbal and written instruction with models to review the exercise physiology of the cardiovascular system and associated critical values. Provides general exercise guidelines with specific guidelines to those with heart or lung disease.    Pulmonary Rehab from 05/13/2017 in [facility] Cardiac and Pulmonary Rehab  Date  05/11/17  Educator  [facility]  Instruction Review Code  1- Verbalizes Understanding      Aerobic Exercise & Resistance Training: - Gives group verbal and written instruction on the various components of exercise. Focuses on aerobic and resistive training programs and the benefits of this training and how to safely progress through these programs.   Flexibility, Balance, Mind/Body Relaxation: Provides group verbal/written instruction on the benefits of flexibility and balance training, including mind/body exercise modes such as yoga, pilates and tai chi.  Demonstration and skill practice provided.   Stress and Anxiety: - Provides group verbal and written  instruction about the health risks of elevated stress and causes of high stress.  Discuss the correlation between heart/lung disease and anxiety and treatment options. Review healthy ways to manage with stress and anxiety.   Pulmonary Rehab from 05/13/2017 in [facility] Cardiac and Pulmonary Rehab  Date  04/06/17  Educator  [facility]  Instruction Review Code  1- Verbalizes Understanding      Depression: - Provides group verbal and written instruction on the correlation between heart/lung disease and depressed mood, treatment options, and the stigmas associated with seeking treatment.   Exercise & Equipment Safety: - Individual verbal instruction and demonstration of equipment use and safety with use of the equipment.   Pulmonary Rehab from 05/13/2017 in [facility] Cardiac and Pulmonary Rehab  Date  03/01/17  Educator  [facility]  Instruction Review Code  1- Verbalizes Understanding      Infection Prevention: - Provides verbal and written material to individual with discussion of infection control including proper hand washing and proper equipment cleaning during exercise session.   Pulmonary Rehab from 05/13/2017 in [facility] Cardiac and Pulmonary Rehab  Date  03/01/17  Educator  [facility]  Instruction Review Code  1- Verbalizes Understanding      Falls Prevention: -  Provides verbal and written material to individual with discussion of falls prevention and safety.   Pulmonary Rehab from 05/13/2017 in [facility] Cardiac and Pulmonary Rehab  Date  03/01/17  Educator  [facility]  Instruction Review Code  1- Verbalizes Understanding      Diabetes: - Individual verbal and written instruction to review signs/symptoms of diabetes, desired ranges of glucose level fasting, after meals and with exercise. Advice that pre and post exercise glucose checks will be done for 3 sessions at entry of program.   Chronic Lung Diseases: - Group verbal and written instruction to review updates, respiratory medications, advancements in procedures and  treatments. Discuss use of supplemental oxygen including available portable oxygen systems, continuous and intermittent flow rates, concentrators, personal use and safety guidelines. Review proper use of inhaler and spacers. Provide informative websites for self-education.    Pulmonary Rehab from 05/13/2017 in [facility] Cardiac and Pulmonary Rehab  Date  04/27/17  Educator  [facility]  Instruction Review Code  1- Verbalizes Understanding      Energy Conservation: - Provide group verbal and written instruction for methods to conserve energy, plan and organize activities. Instruct on pacing techniques, use of adaptive equipment and posture/positioning to relieve shortness of breath.   Pulmonary Rehab from 05/13/2017 in [facility] Cardiac and Pulmonary Rehab  Date  03/30/17  Educator  [facility]  Instruction Review Code  1- Verbalizes Understanding      Triggers and Exacerbations: - Group verbal and written instruction to review types of environmental triggers and ways to prevent exacerbations. Discuss weather changes, air quality and the benefits of nasal washing. Review warning signs and symptoms to help prevent infections. Discuss techniques for effective airway clearance, coughing, and vibrations.   AED/CPR: - Group verbal and written instruction with the use of models to demonstrate the basic use of the AED with the basic ABC's of resuscitation.   Anatomy and Physiology of the Lungs: - Group verbal and written instruction with the use of models to provide basic lung anatomy and physiology related to function, structure and complications of lung disease.   Pulmonary Rehab from 05/13/2017 in [facility] Cardiac and Pulmonary Rehab  Date  04/13/17  Educator  [facility]  Instruction Review Code  1- Verbalizes Understanding      Anatomy & Physiology of the Heart: - Group verbal and written instruction and models provide basic cardiac anatomy and physiology, with the coronary electrical and arterial systems. Review of Valvular  disease and Heart Failure   Cardiac Medications: - Group verbal and written instruction to review commonly prescribed medications for heart disease. Reviews the medication, class of the drug, and side effects.   Know Your Numbers and Risk Factors: -Group verbal and written instruction about important numbers in your health.  Discussion of what are risk factors and how they play a role in the disease process.  Review of Cholesterol, Blood Pressure, Diabetes, and BMI and the role they play in your overall health.   Pulmonary Rehab from 05/13/2017 in [facility] Cardiac and Pulmonary Rehab  Date  05/13/17  Educator  [facility]  Instruction Review Code  1- Verbalizes Understanding      Sleep Hygiene: -Provides group verbal and written instruction about how sleep can affect your health.  Define sleep hygiene, discuss sleep cycles and impact of sleep habits. Review good sleep hygiene tips.    Other: -Provides group and verbal instruction on various topics (see comments)    Knowledge Questionnaire Score: Knowledge Questionnaire Score - 05/11/17 1236  Knowledge Questionnaire Score   Pre Score  15/18    Post Score  -- reviewed with patient        Core Components/Risk Factors/Patient Goals at Admission: Personal Goals and Risk Factors at Admission - 03/01/17 1143      Core Components/Risk Factors/Patient Goals on Admission    Weight Management  Yes;Weight Loss    Intervention  Weight Management: Develop a combined nutrition and exercise program designed to reach desired caloric intake, while maintaining appropriate intake of nutrient and fiber, sodium and fats, and appropriate energy expenditure required for the weight goal.;Weight Management: Provide education and appropriate resources to help participant work on and attain dietary goals.;Weight Management/Obesity: Establish reasonable short term and long term weight goals.;Obesity: Provide education and appropriate resources to help participant  work on and attain dietary goals.    Admit Weight  247 lb (112 kg)    Goal Weight: Short Term  242 lb (109.8 kg)    Goal Weight: Long Term  200 lb (90.7 kg)    Expected Outcomes  Short Term: Continue to assess and modify interventions until short term weight is achieved;Long Term: Adherence to nutrition and physical activity/exercise program aimed toward attainment of established weight goal;Weight Loss: Understanding of general recommendations for a balanced deficit meal plan, which promotes 1-2 lb weight loss per week and includes a negative energy balance of [phone] kcal/d;Understanding recommendations for meals to include 15-35% energy as protein, 25-35% energy from fat, 35-60% energy from carbohydrates, less than 241m of dietary cholesterol, 20-35 gm of total fiber daily;Understanding of distribution of calorie intake throughout the day with the consumption of 4-5 meals/snacks    Improve shortness of breath with ADL's  Yes    Intervention  Provide education, individualized exercise plan and daily activity instruction to help decrease symptoms of SOB with activities of daily living.    Expected Outcomes  Short Term: Improve cardiorespiratory fitness to achieve a reduction of symptoms when performing ADLs;Long Term: Be able to perform more ADLs without symptoms or delay the onset of symptoms    Diabetes  Yes    Intervention  Provide education about proper nutrition, including hydration, and aerobic/resistive exercise prescription along with prescribed medications to achieve blood glucose in normal ranges: Fasting glucose 65-99 mg/dL;Provide education about signs/symptoms and action to take for hypo/hyperglycemia.    Expected Outcomes  Short Term: Participant verbalizes understanding of the signs/symptoms and immediate care of hyper/hypoglycemia, proper foot care and importance of medication, aerobic/resistive exercise and nutrition plan for blood glucose control.;Long Term: Attainment of HbA1C < 7%.     Heart Failure  Yes    Intervention  Provide a combined exercise and nutrition program that is supplemented with education, support and counseling about heart failure. Directed toward relieving symptoms such as shortness of breath, decreased exercise tolerance, and extremity edema.    Expected Outcomes  Improve functional capacity of life;Short term: Attendance in program 2-3 days a week with increased exercise capacity. Reported lower sodium intake. Reported increased fruit and vegetable intake. Reports medication compliance.;Short term: Daily weights obtained and reported for increase. Utilizing diuretic protocols set by physician.;Long term: Adoption of self-care skills and reduction of barriers for early signs and symptoms recognition and intervention leading to self-care maintenance.    Hypertension  Yes    Intervention  Provide education on lifestyle modifcations including regular physical activity/exercise, weight management, moderate sodium restriction and increased consumption of fresh fruit, vegetables, and low fat dairy, alcohol moderation, and smoking cessation.;Monitor prescription use compliance.  Expected Outcomes  Short Term: Continued assessment and intervention until BP is < 140/74m HG in hypertensive participants. < 130/867mHG in hypertensive participants with diabetes, heart failure or chronic kidney disease.;Long Term: Maintenance of blood pressure at goal levels.    Lipids  Yes medications prescribed becuase of Bypass    Intervention  Provide education and support for participant on nutrition & aerobic/resistive exercise along with prescribed medications to achieve LDL <7058mHDL >54m80m  Expected Outcomes  Short Term: Participant states understanding of desired cholesterol values and is compliant with medications prescribed. Participant is following exercise prescription and nutrition guidelines.;Long Term: Cholesterol controlled with medications as prescribed, with  individualized exercise RX and with personalized nutrition plan. Value goals: LDL < 70mg54mL > 40 mg.       Core Components/Risk Factors/Patient Goals Review:  Goals and Risk Factor Review    Row Name 04/04/17 1212 04/25/17 1200 05/20/17 1140         Core Components/Risk Factors/Patient Goals Review   Personal Goals Review  Weight Management/Obesity;Hypertension;Lipids;Improve shortness of breath with ADL's  Weight Management/Obesity;Hypertension;Lipids;Improve shortness of breath with ADL's  Weight Management/Obesity;Hypertension;Lipids;Improve shortness of breath with ADL's     Review  Mario Ward to a good start in rehab.  His weight has been staying the same.  He is scheduled to meet with dietcian tomorrow to talk about diet and weight loss.  His SOB has been staying the same, but he is still new.  He is staying on top his blood presures and blood sugars at home. He checks his sugars daily and his pressures twice a week.  His doctor has been talking about getting the patch for his sugars versus constant sticking and monitoring.  He has not had any problems with his medications.   Mario Ward well in [facility] he gets short of breath doing yardwork. He was trimming using a weed eater and felt like his lungs were burning. Mario Ward shortness of breath he feels has not improved that much. He had to use his inhaler when he was doing yard work last weekend.  He states that he does over exherts himself.  Praise is liking coming to [facility] still. He can tell a minor difference in his SOB. He is trying to take things slower and pacing himself. He can tell a difference with that.      Expected Outcomes  Short: Continue to attend class and work on weight loss.  Long: Continue to manage diabetes.   Short: try to complete task overtime instead of hurrying. Long: maintain not overexherting independently  Short: Lost 2 pounds by working hard at home as well as [facility]. Continue working on pacing  himself. Long: graduate [facility] with independency in pacing himself. Continue losing weight to reach goal weight.         Core Components/Risk Factors/Patient Goals at Discharge (Final Review):  Goals and Risk Factor Review - 05/20/17 1140      Core Components/Risk Factors/Patient Goals Review   Personal Goals Review  Weight Management/Obesity;Hypertension;Lipids;Improve shortness of breath with ADL's    Review  Mario Ward coming to [facility] still. He can tell a minor difference in his SOB. He is trying to take things slower and pacing himself. He can tell a difference with that.     Expected Outcomes  Short: Lost 2 pounds by working hard at home as well as [facility]. Continue working on pacing himself. Long: graduate [facility] with independency in pacing himself.  Continue losing weight to reach goal weight.        ITP Comments: ITP Comments    Row Name 03/01/17 1052 03/07/17 0812 03/14/17 0938 04/11/17 0840 05/09/17 0903   ITP Comments  Medical Evaluation completed. Chart sent for review and changes to Dr. Emily Filbert Director of [place]. Diagnosis can be found in [facility] encounter 03/01/16  Broady called to say that he wont be able to start the program until Feb 25th 2019 due to a surgery procedure on the 20th. He does not want to start and then be out for a week. Informed patient that he is to start on the 25th at 1130 AM.  30 day review completed. ITP sent to Dr. Emily Filbert Director of [place]. Continue with ITP unless changes are made by physician.  30 day review completed. ITP sent to Dr. Emily Filbert Director of [place]. Continue with ITP unless changes are made by physician.   30 day review completed. ITP sent to Dr. Emily Filbert Director of [place]. Continue with ITP unless changes are made by physician   Row Name 06/03/17 1032 06/03/17 1033         ITP Comments  Patient called to say he will be moving and will not be able to attend [facility]. Patient will be discharged   Discharge ITP sent and signed by Dr. Sabra Heck.  Discharge Summary routed to PCP and Pulmonologist.         Comments: Discharge ITP

## 2017-06-03 NOTE — Progress Notes (Signed)
Discharge Progress Report  Patient Details  Name: Mario Ward MRN: [id number] Date of Birth: 11/14/1945 Referring Provider:     Pulmonary Rehab from 03/01/2017 in [facility]  Referring Provider  Raul Del       Number of Visits: 23/36  Reason for Discharge:  Early Exit:  Personal  Smoking History:  Social History   Tobacco Use  Smoking Status Former Smoker  . Packs/day: 1.00  . Years: 40.00  . Pack years: 40.00  . Types: Cigarettes  . Last attempt to quit: 09/07/2006  . Years since quitting: 10.7  Smokeless Tobacco Never Used    Diagnosis:  Chronic obstructive pulmonary disease, unspecified COPD type ([facility])  ADL UCSD: Pulmonary Assessment Scores    Row Name 03/01/17 1115 05/11/17 1236       ADL UCSD   ADL Phase  Entry  -    SOB Score total  31  -    Rest  0  -    Walk  2  -    Stairs  3  -    Bath  2  -    Dress  2  -    Shop  3  -      CAT Score   CAT Score  12  -      mMRC Score   mMRC Score  1  -       Initial Exercise Prescription: Initial Exercise Prescription - 03/01/17 1300      Date of Initial Exercise RX and Referring Provider   Date  03/01/17    Referring Provider  Raul Del      Treadmill   MPH  2    Grade  0    Minutes  15    METs  2.5      NuStep   Level  2    SPM  80    Minutes  15    METs  2.2      REL-XR   Level  2    Speed  50    Minutes  15    METs  2.2      Biostep-RELP   Level  3    SPM  50    Minutes  15    METs  2      Prescription Details   Frequency (times per week)  3    Duration  Progress to 45 minutes of aerobic exercise without signs/symptoms of physical distress      Intensity   THRR 40-80% of Max Heartrate  98-130    Ratings of Perceived Exertion  11-13    Perceived Dyspnea  0-4      Resistance Training   Training Prescription  Yes    Weight  3 lb    Reps  10-15       Discharge Exercise Prescription (Final Exercise Prescription Changes): Exercise Prescription Changes  - 05/25/17 1400      Response to Exercise   Blood Pressure (Admit)  124/70    Blood Pressure (Exit)  132/78    Heart Rate (Admit)  68 bpm    Heart Rate (Exercise)  98 bpm    Heart Rate (Exit)  76 bpm    Oxygen Saturation (Admit)  92 %    Oxygen Saturation (Exercise)  92 %    Oxygen Saturation (Exit)  94 %    Rating of Perceived Exertion (Exercise)  11    Perceived Dyspnea (Exercise)  2  Symptoms  none    Duration  Continue with 45 min of aerobic exercise without signs/symptoms of physical distress.    Intensity  THRR unchanged      Progression   Progression  Continue to progress workloads to maintain intensity without signs/symptoms of physical distress.    Average METs  3.34      Resistance Training   Training Prescription  Yes    Weight  8 lbs    Reps  10-15      Interval Training   Interval Training  No      Treadmill   MPH  2.5    Grade  1.5    Minutes  15    METs  3.43      NuStep   Level  4    SPM  96    Minutes  15    METs  3.8      REL-XR   Level  8    Minutes  15    METs  2.8      Home Exercise Plan   Plans to continue exercise at  Home (comment) walking, treadmill, recumbent bike    Frequency  Add 2 additional days to program exercise sessions.    Initial Home Exercises Provided  04/04/17       Functional Capacity: 6 Minute Walk    Row Name 03/01/17 1300 03/01/17 1304       6 Minute Walk   Distance  1200 feet  -    Walk Time  6 minutes  -    # of Rest Breaks  0  -    MPH  2.27  -    METS  2.29  -    RPE  11  -    Perceived Dyspnea   2  -    VO2 Peak  8.01  -    Symptoms  No  -    Resting HR  66 bpm  -    Resting BP  136/76  -    Resting Oxygen Saturation   94 %  -    Exercise Oxygen Saturation  during 6 min walk  88 %  -    Max Ex. HR  95 bpm  -    Max Ex. BP  192/78  -    2 Minute Post BP  166/82  -      Interval HR   1 Minute HR  -  82    2 Minute HR  -  84    3 Minute HR  -  86    4 Minute HR  -  85    6 Minute HR  -  95     2 Minute Post HR  -  69    Interval Heart Rate?  -  Yes      Interval Oxygen   Interval Oxygen?  -  Yes    Baseline Oxygen Saturation %  -  94 %    1 Minute Oxygen Saturation %  -  95 %    1 Minute Liters of Oxygen  -  0 L    2 Minute Oxygen Saturation %  -  89 %    2 Minute Liters of Oxygen  -  0 L    3 Minute Oxygen Saturation %  -  88 %    3 Minute Liters of Oxygen  -  0 L    4 Minute Oxygen Saturation %  -  92 %    4 Minute Liters of Oxygen  -  0 L    5 Minute Liters of Oxygen  -  0 L    6 Minute Oxygen Saturation %  -  95 %    6 Minute Liters of Oxygen  -  0 L    2 Minute Post Oxygen Saturation %  -  97 %    2 Minute Post Liters of Oxygen  -  0 L       Psychological, QOL, Others - Outcomes: PHQ 2/9: Depression screen PHQ 2/9 03/01/2017  Decreased Interest 0  Down, Depressed, Hopeless 0  PHQ - 2 Score 0  Altered sleeping 0  Tired, decreased energy 1  Change in appetite 2  Feeling bad or failure about yourself  0  Trouble concentrating 0  Moving slowly or fidgety/restless 0  Suicidal thoughts 0  PHQ-9 Score 3  Difficult doing work/chores Somewhat difficult    Quality of Life:   Personal Goals: Goals established at orientation with interventions provided to work toward goal. Personal Goals and Risk Factors at Admission - 03/01/17 1143      Core Components/Risk Factors/Patient Goals on Admission    Weight Management  Yes;Weight Loss    Intervention  Weight Management: Develop a combined nutrition and exercise program designed to reach desired caloric intake, while maintaining appropriate intake of nutrient and fiber, sodium and fats, and appropriate energy expenditure required for the weight goal.;Weight Management: Provide education and appropriate resources to help participant work on and attain dietary goals.;Weight Management/Obesity: Establish reasonable short term and long term weight goals.;Obesity: Provide education and appropriate resources to help  participant work on and attain dietary goals.    Admit Weight  247 lb (112 kg)    Goal Weight: Short Term  242 lb (109.8 kg)    Goal Weight: Long Term  200 lb (90.7 kg)    Expected Outcomes  Short Term: Continue to assess and modify interventions until short term weight is achieved;Long Term: Adherence to nutrition and physical activity/exercise program aimed toward attainment of established weight goal;Weight Loss: Understanding of general recommendations for a balanced deficit meal plan, which promotes 1-2 lb weight loss per week and includes a negative energy balance of [phone] kcal/d;Understanding recommendations for meals to include 15-35% energy as protein, 25-35% energy from fat, 35-60% energy from carbohydrates, less than 200mg  of dietary cholesterol, 20-35 gm of total fiber daily;Understanding of distribution of calorie intake throughout the day with the consumption of 4-5 meals/snacks    Improve shortness of breath with ADL's  Yes    Intervention  Provide education, individualized exercise plan and daily activity instruction to help decrease symptoms of SOB with activities of daily living.    Expected Outcomes  Short Term: Improve cardiorespiratory fitness to achieve a reduction of symptoms when performing ADLs;Long Term: Be able to perform more ADLs without symptoms or delay the onset of symptoms    Diabetes  Yes    Intervention  Provide education about proper nutrition, including hydration, and aerobic/resistive exercise prescription along with prescribed medications to achieve blood glucose in normal ranges: Fasting glucose 65-99 mg/dL;Provide education about signs/symptoms and action to take for hypo/hyperglycemia.    Expected Outcomes  Short Term: Participant verbalizes understanding of the signs/symptoms and immediate care of hyper/hypoglycemia, proper foot care and importance of medication, aerobic/resistive exercise and nutrition plan for blood glucose control.;Long Term: Attainment of  HbA1C < 7%.    Heart Failure  Yes  Intervention  Provide a combined exercise and nutrition program that is supplemented with education, support and counseling about heart failure. Directed toward relieving symptoms such as shortness of breath, decreased exercise tolerance, and extremity edema.    Expected Outcomes  Improve functional capacity of life;Short term: Attendance in program 2-3 days a week with increased exercise capacity. Reported lower sodium intake. Reported increased fruit and vegetable intake. Reports medication compliance.;Short term: Daily weights obtained and reported for increase. Utilizing diuretic protocols set by physician.;Long term: Adoption of self-care skills and reduction of barriers for early signs and symptoms recognition and intervention leading to self-care maintenance.    Hypertension  Yes    Intervention  Provide education on lifestyle modifcations including regular physical activity/exercise, weight management, moderate sodium restriction and increased consumption of fresh fruit, vegetables, and low fat dairy, alcohol moderation, and smoking cessation.;Monitor prescription use compliance.    Expected Outcomes  Short Term: Continued assessment and intervention until BP is < 140/54mm HG in hypertensive participants. < 130/68mm HG in hypertensive participants with diabetes, heart failure or chronic kidney disease.;Long Term: Maintenance of blood pressure at goal levels.    Lipids  Yes medications prescribed becuase of Bypass    Intervention  Provide education and support for participant on nutrition & aerobic/resistive exercise along with prescribed medications to achieve LDL 70mg , HDL >40mg .    Expected Outcomes  Short Term: Participant states understanding of desired cholesterol values and is compliant with medications prescribed. Participant is following exercise prescription and nutrition guidelines.;Long Term: Cholesterol controlled with medications as prescribed, with  individualized exercise RX and with personalized nutrition plan. Value goals: LDL < 70mg , HDL > 40 mg.        Personal Goals Discharge: Goals and Risk Factor Review    Row Name 04/04/17 1212 04/25/17 1200 05/20/17 1140         Core Components/Risk Factors/Patient Goals Review   Personal Goals Review  Weight Management/Obesity;Hypertension;Lipids;Improve shortness of breath with ADL's  Weight Management/Obesity;Hypertension;Lipids;Improve shortness of breath with ADL's  Weight Management/Obesity;Hypertension;Lipids;Improve shortness of breath with ADL's     Review  Mario Ward is off to a good start in rehab.  His weight has been staying the same.  He is scheduled to meet with dietcian tomorrow to talk about diet and weight loss.  His SOB has been staying the same, but he is still new.  He is staying on top his blood presures and blood sugars at home. He checks his sugars daily and his pressures twice a week.  His doctor has been talking about getting the patch for his sugars versus constant sticking and monitoring.  He has not had any problems with his medications.   Mario Ward is doing well in [place]. He states he gets short of breath doing yardwork. He was trimming using a weed eater and felt like his lungs were burning. Mario Ward shortness of breath he feels has not improved that much. He had to use his inhaler when he was doing yard work last weekend.  He states that he does over exherts himself.  Mario Ward is liking coming to [facility] still. He can tell a minor difference in his SOB. He is trying to take things slower and pacing himself. He can tell a difference with that.      Expected Outcomes  Short: Continue to attend class and work on weight loss.  Long: Continue to manage diabetes.   Short: try to complete task overtime instead of hurrying. Long: maintain not overexherting independently  Short: Lost  2 pounds by working hard at home as well as [facility]. Continue working on pacing himself. Long: graduate  [facility] with independency in pacing himself. Continue losing weight to reach goal weight.         Exercise Goals and Review: Exercise Goals    Row Name 03/01/17 1259             Exercise Goals   Increase Physical Activity  Yes       Intervention  Provide advice, education, support and counseling about physical activity/exercise needs.;Develop an individualized exercise prescription for aerobic and resistive training based on initial evaluation findings, risk stratification, comorbidities and participant's personal goals.       Expected Outcomes  Short Term: Attend rehab on a regular basis to increase amount of physical activity.;Long Term: Add in home exercise to make exercise part of routine and to increase amount of physical activity.;Long Term: Exercising regularly at least 3-5 days a week.       Increase Strength and Stamina  Yes       Intervention  Provide advice, education, support and counseling about physical activity/exercise needs.;Develop an individualized exercise prescription for aerobic and resistive training based on initial evaluation findings, risk stratification, comorbidities and participant's personal goals.       Expected Outcomes  Short Term: Increase workloads from initial exercise prescription for resistance, speed, and METs.;Short Term: Perform resistance training exercises routinely during rehab and add in resistance training at home;Long Term: Improve cardiorespiratory fitness, muscular endurance and strength as measured by increased METs and functional capacity (6MWT)       Able to understand and use rate of perceived exertion (RPE) scale  Yes       Intervention  Provide education and explanation on how to use RPE scale       Expected Outcomes  Short Term: Able to use RPE daily in rehab to express subjective intensity level;Long Term:  Able to use RPE to guide intensity level when exercising independently       Able to understand and use Dyspnea scale  Yes        Intervention  Provide education and explanation on how to use Dyspnea scale       Expected Outcomes  Short Term: Able to use Dyspnea scale daily in rehab to express subjective sense of shortness of breath during exertion;Long Term: Able to use Dyspnea scale to guide intensity level when exercising independently       Knowledge and understanding of Target Heart Rate Range (THRR)  Yes       Intervention  Provide education and explanation of THRR including how the numbers were predicted and where they are located for reference       Expected Outcomes  Short Term: Able to state/look up THRR;Long Term: Able to use THRR to govern intensity when exercising independently;Short Term: Able to use daily as guideline for intensity in rehab       Able to check pulse independently  Yes       Intervention  Provide education and demonstration on how to check pulse in carotid and radial arteries.;Review the importance of being able to check your own pulse for safety during independent exercise       Expected Outcomes  Short Term: Able to explain why pulse checking is important during independent exercise;Long Term: Able to check pulse independently and accurately       Understanding of Exercise Prescription  Yes       Intervention  Provide  education, explanation, and written materials on patient's individual exercise prescription       Expected Outcomes  Short Term: Able to explain program exercise prescription;Long Term: Able to explain home exercise prescription to exercise independently          Nutrition & Weight - Outcomes: Pre Biometrics - 03/01/17 1259      Pre Biometrics   Height  5\' 7"  (1.702 m)    Weight  247 lb 6.4 oz (112.2 kg)    Waist Circumference  45 inches    Hip Circumference  48 inches    Waist to Hip Ratio  0.94 %    BMI (Calculated)  38.74        Nutrition: Nutrition Therapy & Goals - 03/01/17 1113      Personal Nutrition Goals   Comments  Weight loss. He would like to meet with  the dietician.      Intervention Plan   Intervention  Prescribe, educate and counsel regarding individualized specific dietary modifications aiming towards targeted core components such as weight, hypertension, lipid management, diabetes, heart failure and other comorbidities.;Nutrition handout(s) given to patient.    Expected Outcomes  Short Term Goal: Understand basic principles of dietary content, such as calories, fat, sodium, cholesterol and nutrients.;Long Term Goal: Adherence to prescribed nutrition plan.       Nutrition Discharge: Nutrition Assessments - 03/01/17 1113      MEDFICTS Scores   Pre Score  66       Education Questionnaire Score: Knowledge Questionnaire Score - 05/11/17 1236      Knowledge Questionnaire Score   Pre Score  15/18    Post Score  -- reviewed with patient       Goals reviewed with patient; copy given to patient.

## 2017-07-25 ENCOUNTER — Other Ambulatory Visit (INDEPENDENT_AMBULATORY_CARE_PROVIDER_SITE_OTHER): Payer: Medicare Other

## 2017-07-25 ENCOUNTER — Encounter (INDEPENDENT_AMBULATORY_CARE_PROVIDER_SITE_OTHER): Payer: Medicare Other

## 2017-07-25 ENCOUNTER — Ambulatory Visit (INDEPENDENT_AMBULATORY_CARE_PROVIDER_SITE_OTHER): Payer: Medicare Other | Admitting: Vascular Surgery

## 2017-09-05 ENCOUNTER — Ambulatory Visit (INDEPENDENT_AMBULATORY_CARE_PROVIDER_SITE_OTHER): Payer: Medicare Other | Admitting: Vascular Surgery

## 2017-09-05 ENCOUNTER — Other Ambulatory Visit (INDEPENDENT_AMBULATORY_CARE_PROVIDER_SITE_OTHER): Payer: Medicare Other

## 2017-09-05 ENCOUNTER — Encounter (INDEPENDENT_AMBULATORY_CARE_PROVIDER_SITE_OTHER): Payer: Medicare Other

## 2017-09-25 IMAGING — CT CT CHEST W/ CM
2 of 3 series · 15 of 31 positions shown, 18 images · IV contrast (iopamidol)
Comparison: 02/24/2015

CLINICAL DATA: Carcinoid tumor of the duodenum

EXAM:
CT CHEST, ABDOMEN, AND PELVIS WITH CONTRAST
TECHNIQUE: Multidetector CT imaging of the chest, abdomen and pelvis was
performed following the standard protocol during bolus
administration of intravenous contrast.
CONTRAST:  100mL [id number] IOPAMIDOL ([id number]) INJECTION 61%

[Series 2: cap with · axial · 0.90mm/px · z∈[-1007,-467]mm · 10 of 136 slices shown, 13 images]
[im 14/136  mediastinal]
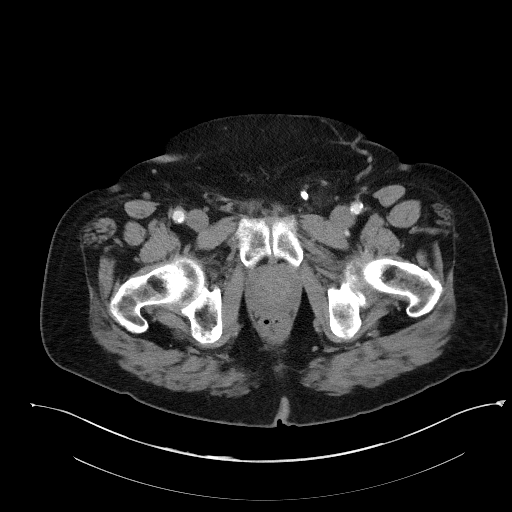
[im 14/136  lung]
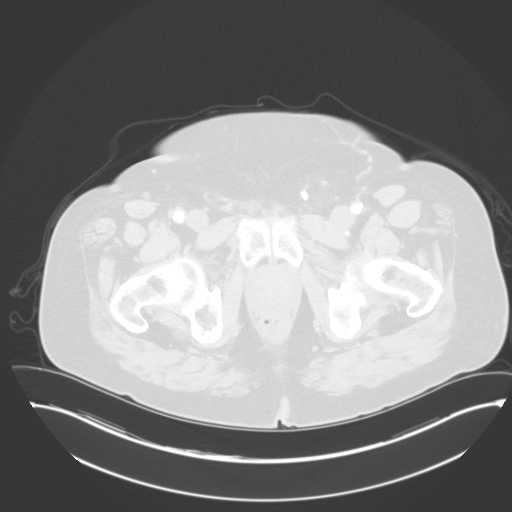
[im 28/136  lung]
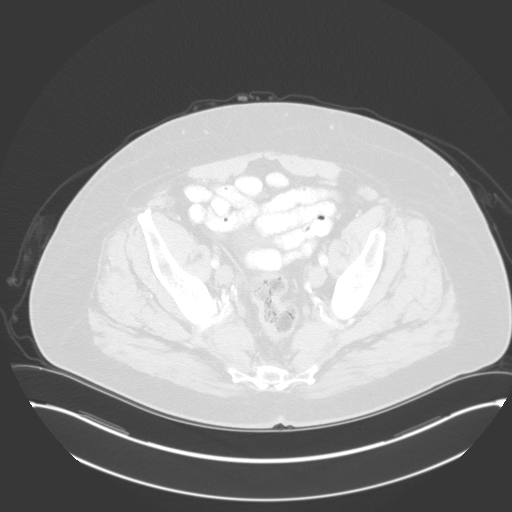
[im 41/136  lung]
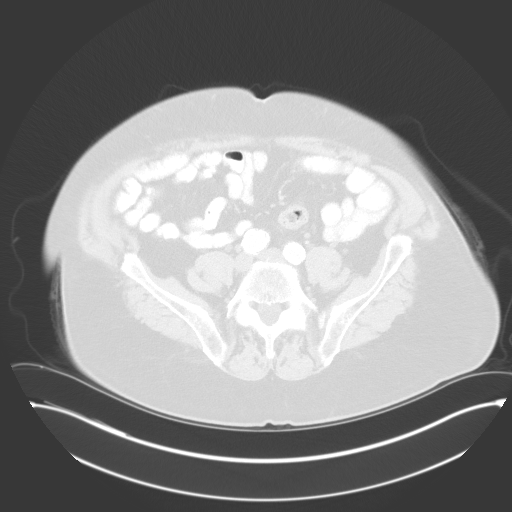
[im 55/136  lung]
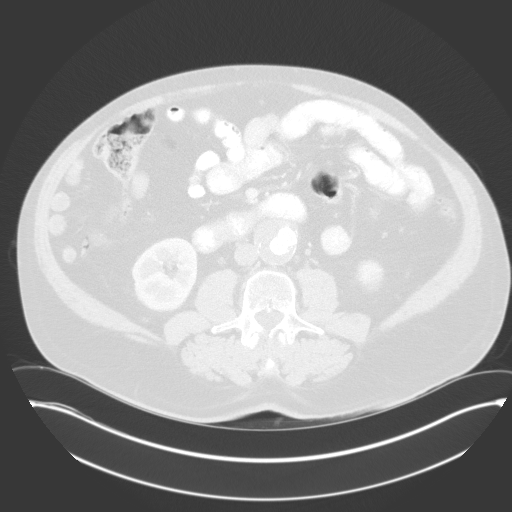
[im 67/136  mediastinal]
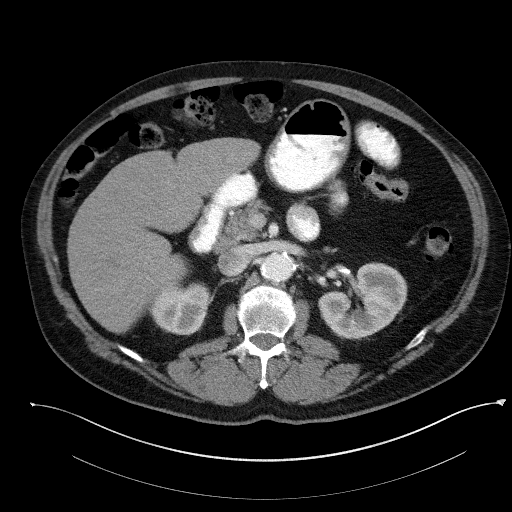
[im 67/136  lung]
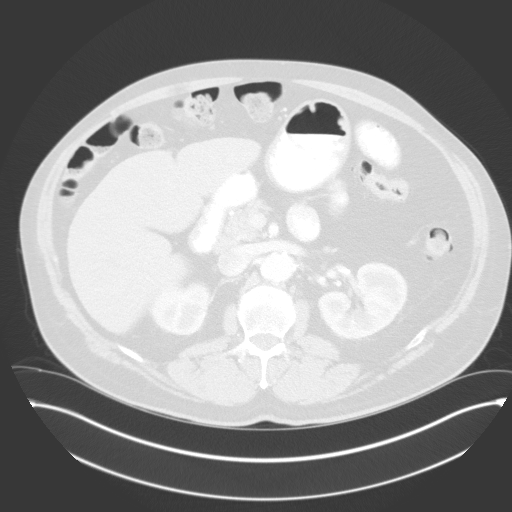
[im 68/136  lung]
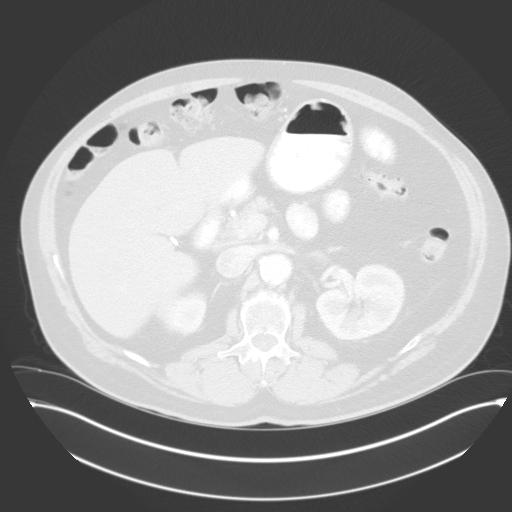
[im 82/136  lung]
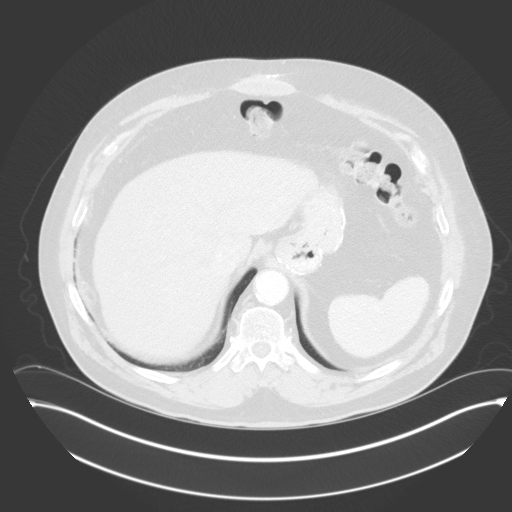
[im 95/136  lung]
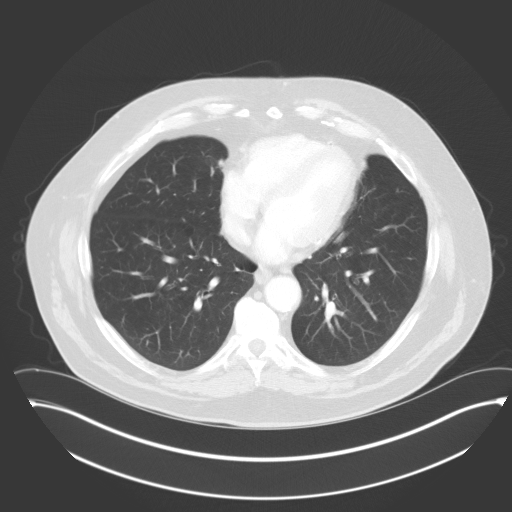
[im 109/136  mediastinal]
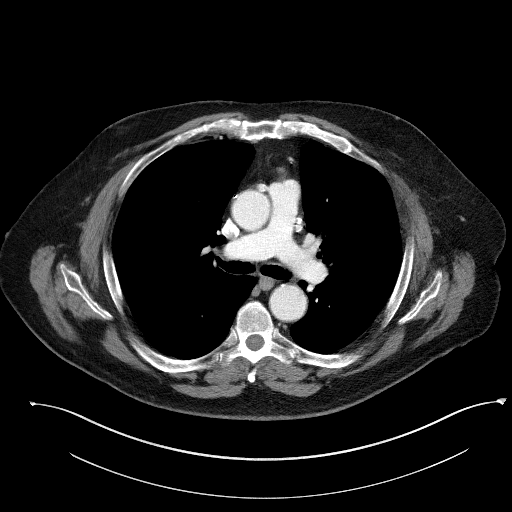
[im 109/136  lung]
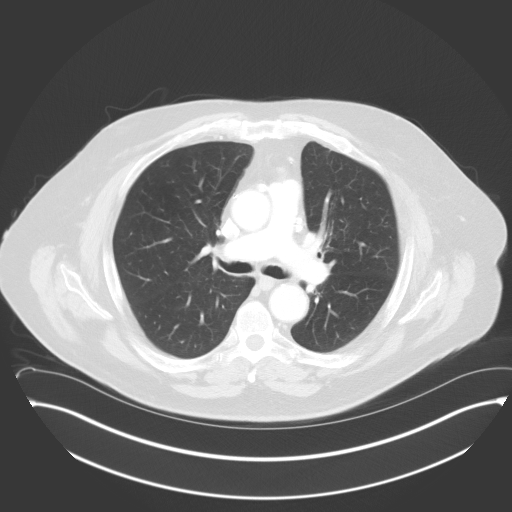
[im 122/136  lung]
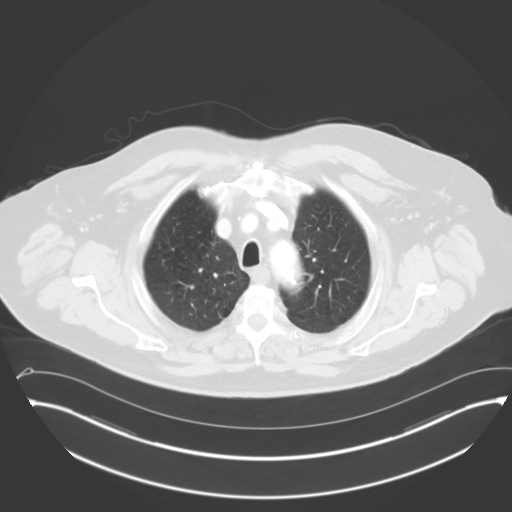

[Series 4: lung · axial · 0.90mm/px · z∈[-713,-555]mm · 5 of 172 slices shown]
[im 14/172  lung]
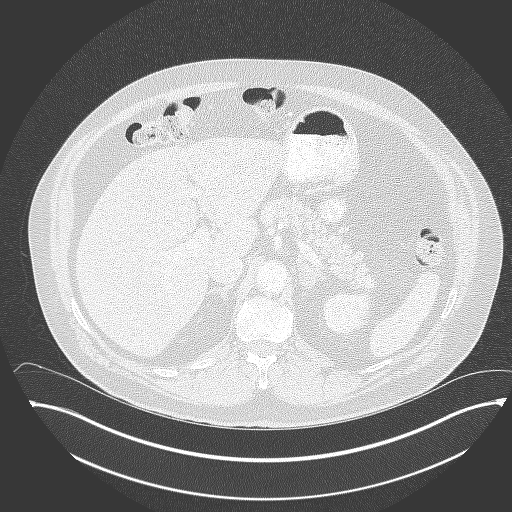
[im 40/172  lung]
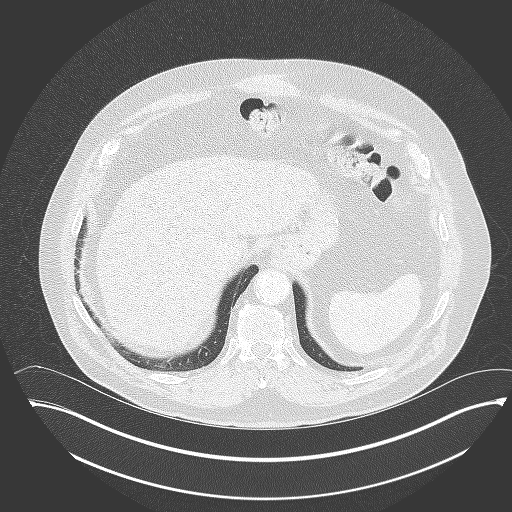
[im 66/172  lung]
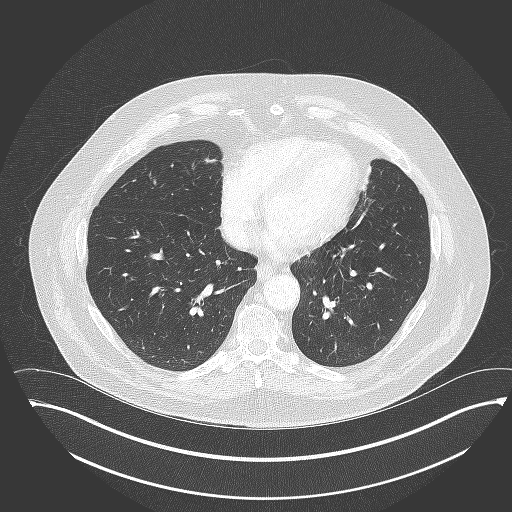
[im 79/172  lung]
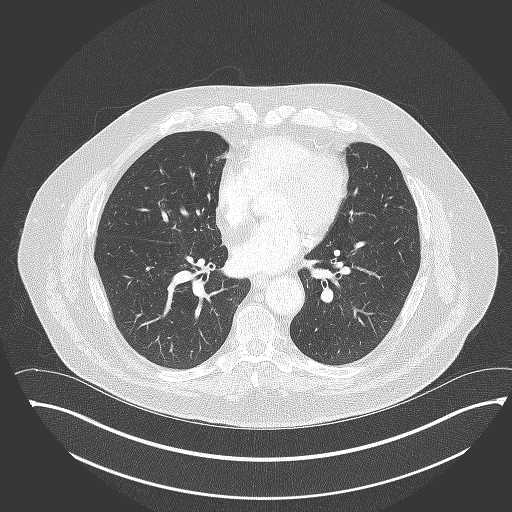
[im 93/172  lung]
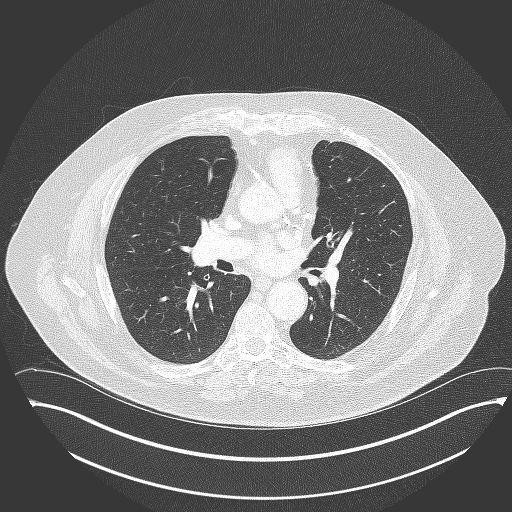

[15 of 31 positions shown; findings below may reference images not displayed]

FINDINGS: CT CHEST FINDINGS

Cardiovascular: The heart size is normal. Previous median sternotomy
and CABG procedure. Aortic atherosclerosis. No pericardial effusion.

Mediastinum/Nodes: No enlarged mediastinal, hilar, or axillary lymph
nodes. Thyroid gland, trachea, and esophagus demonstrate no
significant findings.

Lungs/Pleura: No pleural effusion. Moderate changes of centrilobular
emphysema. No airspace consolidation or atelectasis identified. No
suspicious pulmonary nodule or mass identified.

Musculoskeletal: No aggressive lytic or sclerotic bone lesions.

CT ABDOMEN PELVIS FINDINGS

Hepatobiliary: No focal liver abnormality is seen. Status post
cholecystectomy. No biliary dilatation.

Pancreas: Unremarkable. No pancreatic ductal dilatation or
surrounding inflammatory changes.

Spleen: Normal in size without focal abnormality.

Adrenals/Urinary Tract: Normal appearance of the right adrenal
gland. Nodule in the left adrenal gland measures 1.6 cm, image 64 of
series 2. Unchanged from previous exam. Normal appearance of the
right kidney. Several left kidney cysts are identified. No mass or
hydronephrosis. Urinary bladder appears unremarkable.

Stomach/Bowel: Postsurgical changes involving the stomach are
identified. The small bowel loops have a normal course and caliber
without obstruction. The appendix is visualized and is unremarkable.
Normal appearance of the colon.

Vascular/Lymphatic: Previous stent graft repair of infrarenal
abdominal aortic aneurysm. On today's study the aneurysm sac
measures 4 cm, image 83 of series 2. Previously 4.7 cm. No upper
abdominal adenopathy. No pelvic or inguinal adenopathy.

Reproductive: Prostate gland is unremarkable. Penile prosthesis
apparatus is identified within the anterior left hemipelvis and left
inguinal canal.

Other: No ascites.  No focal fluid collections.

Musculoskeletal: Degenerative disc disease is identified within the
lumbar spine.
IMPRESSION: 1. No specific findings identified to suggest metastatic disease
from patient's known duodenal carcinoid tumor.
2. The small mucosal abnormality involving the proximal duodenum is
difficult to confidently identified on this exam. Please see results
of recent endoscopy from [REDACTED] on 04/15/16.
3. Aortic atherosclerosis. Status post stent graft repair of
infrarenal abdominal aortic aneurysm.
4. Prior CABG procedure.

## 2018-03-17 ENCOUNTER — Ambulatory Visit: Payer: Medicare Other | Admitting: Urology

## 2020-03-14 ENCOUNTER — Telehealth: Payer: Self-pay | Admitting: Cardiovascular Disease

## 2020-03-14 NOTE — Telephone Encounter (Signed)
3 attempts to schedule fu appt from recall list.   Deleting recall.
